# Patient Record
Sex: Female | Born: 1990 | Hispanic: Yes | Smoking: Never smoker
Health system: Southern US, Community
[De-identification: ages and names within clinical notes are randomized; demographics above are authoritative.]

## PROBLEM LIST (undated history)

## (undated) DIAGNOSIS — D649 Anemia, unspecified: Secondary | ICD-10-CM

## (undated) DIAGNOSIS — N83209 Unspecified ovarian cyst, unspecified side: Secondary | ICD-10-CM

## (undated) HISTORY — DX: Anemia, unspecified: D64.9

## (undated) HISTORY — DX: Unspecified ovarian cyst, unspecified side: N83.209

---
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
## 2012-01-12 HISTORY — PX: CHOLECYSTECTOMY: SHX55

## 2012-05-21 ENCOUNTER — Emergency Department: Payer: Self-pay | Admitting: Internal Medicine

## 2012-08-02 ENCOUNTER — Emergency Department: Payer: Self-pay | Admitting: Emergency Medicine

## 2012-08-02 LAB — CBC
HGB: 11.5 g/dL — ABNORMAL LOW (ref 12.0–16.0)
MCH: 24.5 pg — ABNORMAL LOW (ref 26.0–34.0)
MCHC: 34.2 g/dL (ref 32.0–36.0)
MCV: 72 fL — ABNORMAL LOW (ref 80–100)
Platelet: 385 10*3/uL (ref 150–440)
RDW: 15.4 % — ABNORMAL HIGH (ref 11.5–14.5)

## 2012-08-02 LAB — URINALYSIS, COMPLETE
Bacteria: NONE SEEN
Ketone: NEGATIVE
Nitrite: NEGATIVE
Ph: 6 (ref 4.5–8.0)
RBC,UR: 21 /HPF (ref 0–5)
Specific Gravity: 1.025 (ref 1.003–1.030)
Squamous Epithelial: 3

## 2012-08-02 LAB — COMPREHENSIVE METABOLIC PANEL
Albumin: 3.7 g/dL — ABNORMAL LOW (ref 3.8–5.6)
Anion Gap: 7 (ref 7–16)
BUN: 11 mg/dL (ref 7–18)
Bilirubin,Total: 0.2 mg/dL (ref 0.2–1.0)
Calcium, Total: 8.4 mg/dL — ABNORMAL LOW (ref 9.0–10.7)
Co2: 26 mmol/L (ref 21–32)
EGFR (African American): >60
EGFR (Non-African Amer.): >60
Glucose: 119 mg/dL — ABNORMAL HIGH (ref 65–99)
Osmolality: 280 (ref 275–301)
Potassium: 4 mmol/L (ref 3.5–5.1)

## 2012-08-02 LAB — WET PREP, GENITAL

## 2012-08-03 LAB — GC/CHLAMYDIA PROBE AMP

## 2013-01-19 LAB — URINALYSIS, COMPLETE
Bacteria: NONE SEEN
Blood: NEGATIVE
Ketone: NEGATIVE
Leukocyte Esterase: NEGATIVE
Nitrite: NEGATIVE
RBC,UR: <1 /HPF (ref 0–5)
Specific Gravity: 1.009 (ref 1.003–1.030)
Squamous Epithelial: 5

## 2013-01-19 LAB — COMPREHENSIVE METABOLIC PANEL
Albumin: 3.6 g/dL (ref 3.4–5.0)
Alkaline Phosphatase: 79 U/L
Anion Gap: 5 — ABNORMAL LOW (ref 7–16)
BUN: 7 mg/dL (ref 7–18)
Bilirubin,Total: 0.2 mg/dL (ref 0.2–1.0)
Calcium, Total: 8.8 mg/dL (ref 8.5–10.1)
Chloride: 106 mmol/L (ref 98–107)
Co2: 25 mmol/L (ref 21–32)
Creatinine: 0.61 mg/dL (ref 0.60–1.30)
EGFR (African American): >60
EGFR (Non-African Amer.): >60
Glucose: 122 mg/dL — ABNORMAL HIGH (ref 65–99)
Osmolality: 271 (ref 275–301)
Potassium: 3.8 mmol/L (ref 3.5–5.1)
Sodium: 136 mmol/L (ref 136–145)
Total Protein: 7.7 g/dL (ref 6.4–8.2)

## 2013-01-19 LAB — CBC
HCT: 32.1 % — ABNORMAL LOW (ref 35.0–47.0)
HGB: 10.2 g/dL — ABNORMAL LOW (ref 12.0–16.0)
MCHC: 31.6 g/dL — ABNORMAL LOW (ref 32.0–36.0)
Platelet: 457 10*3/uL — ABNORMAL HIGH (ref 150–440)
RDW: 19.5 % — ABNORMAL HIGH (ref 11.5–14.5)
WBC: 11 10*3/uL (ref 3.6–11.0)

## 2013-01-20 ENCOUNTER — Observation Stay: Payer: Self-pay | Admitting: Surgery

## 2013-01-20 LAB — GC/CHLAMYDIA PROBE AMP

## 2013-01-20 LAB — WET PREP, GENITAL

## 2013-01-20 LAB — AMYLASE: Amylase: 77 U/L (ref 25–115)

## 2013-01-21 LAB — COMPREHENSIVE METABOLIC PANEL
Albumin: 2.9 g/dL — ABNORMAL LOW (ref 3.4–5.0)
Alkaline Phosphatase: 74 U/L
Bilirubin,Total: 0.3 mg/dL (ref 0.2–1.0)
Calcium, Total: 8.5 mg/dL (ref 8.5–10.1)
Co2: 25 mmol/L (ref 21–32)
Creatinine: 0.47 mg/dL — ABNORMAL LOW (ref 0.60–1.30)
EGFR (African American): >60
EGFR (Non-African Amer.): >60
Osmolality: 271 (ref 275–301)
SGPT (ALT): 86 U/L — ABNORMAL HIGH (ref 12–78)
Sodium: 137 mmol/L (ref 136–145)

## 2013-01-21 LAB — CBC WITH DIFFERENTIAL/PLATELET
Basophil #: 0 10*3/uL (ref 0.0–0.1)
Basophil %: 0.1 %
Eosinophil #: 0.1 10*3/uL (ref 0.0–0.7)
HGB: 8.9 g/dL — ABNORMAL LOW (ref 12.0–16.0)
Lymphocyte #: 2.5 10*3/uL (ref 1.0–3.6)
MCHC: 31.5 g/dL — ABNORMAL LOW (ref 32.0–36.0)
MCV: 64 fL — ABNORMAL LOW (ref 80–100)
Neutrophil #: 2.9 10*3/uL (ref 1.4–6.5)
Neutrophil %: 48 %
Platelet: 346 10*3/uL (ref 150–440)
WBC: 6.1 10*3/uL (ref 3.6–11.0)

## 2013-01-21 LAB — AMYLASE: Amylase: 57 U/L (ref 25–115)

## 2013-05-29 ENCOUNTER — Emergency Department: Payer: Self-pay | Admitting: Emergency Medicine

## 2013-05-29 LAB — CBC
HCT: 36 % (ref 35.0–47.0)
HGB: 11.4 g/dL — ABNORMAL LOW (ref 12.0–16.0)
MCH: 23.3 pg — AB (ref 26.0–34.0)
MCHC: 31.8 g/dL — ABNORMAL LOW (ref 32.0–36.0)
MCV: 73 fL — AB (ref 80–100)
Platelet: 300 10*3/uL (ref 150–440)
RBC: 4.91 10*6/uL (ref 3.80–5.20)
RDW: 17.3 % — AB (ref 11.5–14.5)
WBC: 9.2 10*3/uL (ref 3.6–11.0)

## 2013-05-29 LAB — URINALYSIS, COMPLETE
BILIRUBIN, UR: NEGATIVE
BLOOD: NEGATIVE
Bacteria: NONE SEEN
Glucose,UR: NEGATIVE mg/dL (ref 0–75)
KETONE: NEGATIVE
Leukocyte Esterase: NEGATIVE
NITRITE: NEGATIVE
PROTEIN: NEGATIVE
Ph: 5 (ref 4.5–8.0)
RBC,UR: 1 /HPF (ref 0–5)
Specific Gravity: 1.03 (ref 1.003–1.030)
Squamous Epithelial: 10
WBC UR: 2 /HPF (ref 0–5)

## 2013-05-29 LAB — COMPREHENSIVE METABOLIC PANEL
ALT: 39 U/L (ref 12–78)
Albumin: 3.6 g/dL (ref 3.4–5.0)
Alkaline Phosphatase: 69 U/L
Anion Gap: 5 — ABNORMAL LOW (ref 7–16)
BILIRUBIN TOTAL: 0.2 mg/dL (ref 0.2–1.0)
BUN: 9 mg/dL (ref 7–18)
CHLORIDE: 106 mmol/L (ref 98–107)
Calcium, Total: 8.5 mg/dL (ref 8.5–10.1)
Co2: 27 mmol/L (ref 21–32)
Creatinine: 0.6 mg/dL (ref 0.60–1.30)
EGFR (Non-African Amer.): >60
Glucose: 109 mg/dL — ABNORMAL HIGH (ref 65–99)
OSMOLALITY: 275 (ref 275–301)
Potassium: 3.6 mmol/L (ref 3.5–5.1)
SGOT(AST): 24 U/L (ref 15–37)
Sodium: 138 mmol/L (ref 136–145)
TOTAL PROTEIN: 7.3 g/dL (ref 6.4–8.2)

## 2013-05-29 LAB — LIPASE, BLOOD: Lipase: 175 U/L (ref 73–393)

## 2013-05-29 LAB — PREGNANCY, URINE: Pregnancy Test, Urine: NEGATIVE m[IU]/mL

## 2013-09-10 ENCOUNTER — Ambulatory Visit: Payer: Self-pay | Admitting: Unknown Physician Specialty

## 2013-09-10 ENCOUNTER — Ambulatory Visit: Payer: Self-pay | Admitting: Gastroenterology

## 2013-09-13 LAB — PATHOLOGY REPORT

## 2013-09-15 LAB — CLOSTRIDIUM DIFFICILE(ARMC)

## 2013-09-18 LAB — STOOL CULTURE

## 2013-09-28 LAB — HM PAP SMEAR: HM PAP: NEGATIVE

## 2013-11-16 ENCOUNTER — Emergency Department: Payer: Self-pay | Admitting: Emergency Medicine

## 2013-11-16 LAB — CBC WITH DIFFERENTIAL/PLATELET
BASOS ABS: 0 10*3/uL (ref 0.0–0.1)
Basophil %: 0.2 %
EOS ABS: 0.2 10*3/uL (ref 0.0–0.7)
EOS PCT: 1.4 %
HCT: 39.8 % (ref 35.0–47.0)
HGB: 13.1 g/dL (ref 12.0–16.0)
LYMPHS PCT: 16.2 %
Lymphocyte #: 1.8 10*3/uL (ref 1.0–3.6)
MCH: 26 pg (ref 26.0–34.0)
MCHC: 32.8 g/dL (ref 32.0–36.0)
MCV: 79 fL — ABNORMAL LOW (ref 80–100)
MONO ABS: 0.6 x10 3/mm (ref 0.2–0.9)
MONOS PCT: 5.1 %
NEUTROS ABS: 8.4 10*3/uL — AB (ref 1.4–6.5)
Neutrophil %: 77.1 %
Platelet: 309 10*3/uL (ref 150–440)
RBC: 5.04 10*6/uL (ref 3.80–5.20)
RDW: 15 % — ABNORMAL HIGH (ref 11.5–14.5)
WBC: 10.9 10*3/uL (ref 3.6–11.0)

## 2013-11-16 LAB — URINALYSIS, COMPLETE
Bacteria: NONE SEEN
Bilirubin,UR: NEGATIVE
Glucose,UR: NEGATIVE mg/dL (ref 0–75)
Ketone: NEGATIVE
Leukocyte Esterase: NEGATIVE
Nitrite: NEGATIVE
PH: 5 (ref 4.5–8.0)
Protein: NEGATIVE
RBC,UR: 3 /HPF (ref 0–5)
SPECIFIC GRAVITY: 1.026 (ref 1.003–1.030)
Squamous Epithelial: 2

## 2013-11-16 LAB — COMPREHENSIVE METABOLIC PANEL
ALBUMIN: 3.5 g/dL (ref 3.4–5.0)
ALT: 26 U/L
Alkaline Phosphatase: 60 U/L
Anion Gap: 6 — ABNORMAL LOW (ref 7–16)
BUN: 6 mg/dL — AB (ref 7–18)
Bilirubin,Total: 0.4 mg/dL (ref 0.2–1.0)
CALCIUM: 8.4 mg/dL — AB (ref 8.5–10.1)
Chloride: 106 mmol/L (ref 98–107)
Co2: 26 mmol/L (ref 21–32)
Creatinine: 0.69 mg/dL (ref 0.60–1.30)
EGFR (African American): >60
EGFR (Non-African Amer.): >60
Glucose: 95 mg/dL (ref 65–99)
Osmolality: 273 (ref 275–301)
POTASSIUM: 3.6 mmol/L (ref 3.5–5.1)
SGOT(AST): 18 U/L (ref 15–37)
Sodium: 138 mmol/L (ref 136–145)
Total Protein: 7.7 g/dL (ref 6.4–8.2)

## 2013-11-16 LAB — LIPASE, BLOOD: Lipase: 142 U/L (ref 73–393)

## 2014-05-04 ENCOUNTER — Emergency Department: Payer: Self-pay | Admitting: Emergency Medicine

## 2014-05-04 LAB — CBC WITH DIFFERENTIAL/PLATELET
BASOS PCT: 0.1 %
Basophil #: 0 10*3/uL (ref 0.0–0.1)
EOS PCT: 0 %
Eosinophil #: 0 10*3/uL (ref 0.0–0.7)
HCT: 36.7 % (ref 35.0–47.0)
HGB: 11.7 g/dL — AB (ref 12.0–16.0)
Lymphocyte #: 1.7 10*3/uL (ref 1.0–3.6)
Lymphocyte %: 11.6 %
MCH: 24.7 pg — ABNORMAL LOW (ref 26.0–34.0)
MCHC: 31.9 g/dL — ABNORMAL LOW (ref 32.0–36.0)
MCV: 77 fL — AB (ref 80–100)
Monocyte #: 1 x10 3/mm — ABNORMAL HIGH (ref 0.2–0.9)
Monocyte %: 7.1 %
Neutrophil #: 11.7 10*3/uL — ABNORMAL HIGH (ref 1.4–6.5)
Neutrophil %: 81.2 %
PLATELETS: 310 10*3/uL (ref 150–440)
RBC: 4.74 10*6/uL (ref 3.80–5.20)
RDW: 14.8 % — ABNORMAL HIGH (ref 11.5–14.5)
WBC: 14.4 10*3/uL — AB (ref 3.6–11.0)

## 2014-05-04 LAB — URINALYSIS, COMPLETE
Bacteria: NONE SEEN
Bilirubin,UR: NEGATIVE
Glucose,UR: NEGATIVE mg/dL (ref 0–75)
KETONE: NEGATIVE
LEUKOCYTE ESTERASE: NEGATIVE
NITRITE: NEGATIVE
PH: 7 (ref 4.5–8.0)
Protein: NEGATIVE
Specific Gravity: 1.004 (ref 1.003–1.030)
Squamous Epithelial: 1

## 2014-05-06 LAB — URINE CULTURE

## 2014-05-09 LAB — CULTURE, BLOOD (SINGLE)
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
## 2014-09-12 LAB — COMPREHENSIVE METABOLIC PANEL
ALBUMIN: 4 g/dL (ref 3.4–5.0)
ALK PHOS: 66 U/L (ref 46–116)
Anion Gap: 8 (ref 7–16)
BUN: 7 mg/dL (ref 7–18)
Bilirubin,Total: 0.7 mg/dL (ref 0.2–1.0)
CALCIUM: 8.5 mg/dL (ref 8.5–10.1)
CO2: 25 mmol/L (ref 21–32)
Chloride: 103 mmol/L (ref 98–107)
Creatinine: 0.54 mg/dL — ABNORMAL LOW (ref 0.60–1.30)
EGFR (African American): >60
GLUCOSE: 95 mg/dL (ref 65–99)
Potassium: 3.6 mmol/L (ref 3.5–5.1)
SGOT(AST): 16 U/L (ref 15–37)
SGPT (ALT): 21 U/L (ref 14–63)
Sodium: 136 mmol/L (ref 136–145)
Total Protein: 7.4 g/dL (ref 6.4–8.2)

## 2016-03-30 ENCOUNTER — Emergency Department: Payer: Self-pay

## 2016-03-30 ENCOUNTER — Emergency Department
Admission: EM | Admit: 2016-03-30 | Discharge: 2016-03-30 | Disposition: A | Discharge status: Home / Self Care | Payer: Self-pay | Attending: Emergency Medicine | Admitting: Emergency Medicine

## 2016-03-30 ENCOUNTER — Encounter: Payer: Self-pay | Admitting: Emergency Medicine

## 2016-03-30 DIAGNOSIS — R112 Nausea with vomiting, unspecified: Secondary | ICD-10-CM | POA: Insufficient documentation

## 2016-03-30 DIAGNOSIS — B9789 Other viral agents as the cause of diseases classified elsewhere: Secondary | ICD-10-CM

## 2016-03-30 DIAGNOSIS — J069 Acute upper respiratory infection, unspecified: Secondary | ICD-10-CM | POA: Insufficient documentation

## 2016-03-30 DIAGNOSIS — B349 Viral infection, unspecified: Secondary | ICD-10-CM | POA: Insufficient documentation

## 2016-03-30 LAB — CBC WITH DIFFERENTIAL/PLATELET
Basophils Absolute: 0 10*3/uL (ref 0–0.1)
Basophils Relative: 0 %
Eosinophils Absolute: 0.3 10*3/uL (ref 0–0.7)
Eosinophils Relative: 6 %
HCT: 37.7 % (ref 35.0–47.0)
HEMOGLOBIN: 12.9 g/dL (ref 12.0–16.0)
LYMPHS ABS: 2.5 10*3/uL (ref 1.0–3.6)
LYMPHS PCT: 47 %
MCH: 26.1 pg (ref 26.0–34.0)
MCHC: 34.3 g/dL (ref 32.0–36.0)
MCV: 76.1 fL — AB (ref 80.0–100.0)
MONOS PCT: 9 %
Monocytes Absolute: 0.4 10*3/uL (ref 0.2–0.9)
NEUTROS ABS: 2 10*3/uL (ref 1.4–6.5)
NEUTROS PCT: 38 %
Platelets: 281 10*3/uL (ref 150–440)
RBC: 4.95 MIL/uL (ref 3.80–5.20)
RDW: 14.8 % — ABNORMAL HIGH (ref 11.5–14.5)
WBC: 5.2 10*3/uL (ref 3.6–11.0)

## 2016-03-30 LAB — COMPREHENSIVE METABOLIC PANEL
ALK PHOS: 57 U/L (ref 38–126)
ALT: 76 U/L — AB (ref 14–54)
AST: 48 U/L — ABNORMAL HIGH (ref 15–41)
Albumin: 4.2 g/dL (ref 3.5–5.0)
Anion gap: 9 (ref 5–15)
BILIRUBIN TOTAL: 0.5 mg/dL (ref 0.3–1.2)
BUN: 7 mg/dL (ref 6–20)
CALCIUM: 8.7 mg/dL — AB (ref 8.9–10.3)
CO2: 25 mmol/L (ref 22–32)
CREATININE: 0.61 mg/dL (ref 0.44–1.00)
Chloride: 103 mmol/L (ref 101–111)
Glucose, Bld: 110 mg/dL — ABNORMAL HIGH (ref 65–99)
Potassium: 3.2 mmol/L — ABNORMAL LOW (ref 3.5–5.1)
SODIUM: 137 mmol/L (ref 135–145)
Total Protein: 8 g/dL (ref 6.5–8.1)

## 2016-03-30 LAB — LIPASE, BLOOD: Lipase: 34 U/L (ref 11–51)

## 2016-03-30 MED ORDER — ONDANSETRON HCL 4 MG/2ML IJ SOLN
4.0000 mg | INTRAMUSCULAR | Status: AC
  Administered 2016-03-30: 4 mg via INTRAVENOUS
  Filled 2016-03-30: qty 2

## 2016-03-30 MED ORDER — HYDROCODONE-HOMATROPINE 5-1.5 MG/5ML PO SYRP
5.0000 mL | ORAL_SOLUTION | ORAL | Status: AC
  Administered 2016-03-30: 5 mL via ORAL
  Filled 2016-03-30: qty 5

## 2016-03-30 MED ORDER — ONDANSETRON 4 MG PO TBDP: ORAL_TABLET | ORAL | 0 refills | Status: DC

## 2016-03-30 MED ORDER — SODIUM CHLORIDE 0.9 % IV BOLUS (SEPSIS)
1000.0000 mL | INTRAVENOUS | Status: AC
  Administered 2016-03-30: 1000 mL via INTRAVENOUS

## 2016-03-30 MED ORDER — HYDROCODONE-HOMATROPINE 5-1.5 MG/5ML PO SYRP: 5.0000 mL | ORAL_SOLUTION | Freq: Four times a day (QID) | ORAL | 0 refills | Status: DC | PRN

## 2016-03-30 NOTE — ED Notes (Signed)
Pt states flu like symptoms for one week with accompanied nausea, vomiting and diarrhea. Pt states last diarrhea was 03/29/2016 and vomiting continues intermittently. Pt denies fever, chills. Pt with dry cough during iv initation noted.

## 2016-03-30 NOTE — ED Triage Notes (Signed)
Pt ambulatory to triage in NAD, report cold/flu sx x 1 week, reports cough, productive, tonight started coughing up blood.  Pt reports pain in throat and epigastric area.  Pt reports ongoing vomiting.  Pt NAD at this time.

## 2016-03-30 NOTE — Discharge Instructions (Signed)

## 2016-03-30 NOTE — ED Provider Notes (Signed)
[facility] Emergency Department Provider Note  ____________________________________________   First MD Initiated Contact with Patient 03/30/16 [phone]     (approximate)  I have reviewed the triage vital signs and the nursing notes.   HISTORY  Chief Complaint Hemoptysis and Emesis    HPI Tina Gutierrez is a 26 y.o. female with no chronic medical history who presents for evaluation of hemoptysis.  She states that about a week ago she developed URI symptoms including body aches, fever, cough, congestion.  All of her symptoms have improved but she states the cough has persisted and is productive.  Tonight she noticed that there was some blood mixed in with the sputum so she came in to be evaluated.  She states that in general she feels better than she did before except that she has also been vomiting over the last several days and now her throat feels raw as well.  She denies any current fever/chills, chest pain, shortness of breath, abdominal pain other than the burning sensation in her throat.  She states that the onset of the hemoptysis was acute with rest symptoms have been gradually improving over the last week.  Nothing in particular makes symptoms better nor worse.  She has not been taking any medication.   History reviewed. No pertinent past medical history.  There are no active problems to display for this patient.   History reviewed. No pertinent surgical history.  Prior to Admission medications   Medication Sig Start Date End Date Taking? Authorizing Provider  HYDROcodone-homatropine (HYCODAN) 5-1.5 MG/5ML syrup Take 5 mLs by mouth every 6 (six) hours as needed for cough. 03/30/16   Loleta Rose, MD  ondansetron (ZOFRAN ODT) 4 MG disintegrating tablet Allow 1-2 tablets to dissolve in your mouth every 8 hours as needed for nausea/vomiting 03/30/16   Loleta Rose, MD    Allergies Patient has no known allergies.  History reviewed. No pertinent family  history.  Social History Social History  Substance Use Topics  . Smoking status: Never Smoker  . Smokeless tobacco: Never Used  . Alcohol use No    Review of Systems Constitutional: Recent fever/chills, but improved over several days Eyes: No visual changes. ENT: No sore throat. Cardiovascular: Denies chest pain. Respiratory: Denies shortness of breath.  Productive cough with blood-tinged sputum. Gastrointestinal: Burning sensation in throat/esophagus from recent vomiting.  Persistent N/V for several days.  No diarrhea.  No constipation. Genitourinary: Negative for dysuria. Musculoskeletal: Negative for back pain. Skin: Negative for rash. Neurological: Negative for headaches, focal weakness or numbness.  10-point ROS otherwise negative.  ____________________________________________   PHYSICAL EXAM:  VITAL SIGNS: ED Triage Vitals  Enc Vitals Group     BP 03/30/16 0409 127/76     Pulse Rate 03/30/16 0409 (!) 101     Resp 03/30/16 0409 20     Temp 03/30/16 0409 98.9 F (37.2 C)     Temp Source 03/30/16 0409 Oral     SpO2 03/30/16 0409 96 %     Weight 03/30/16 0410 145 lb (65.8 kg)     Height 03/30/16 0410 5\' 2"  (1.575 m)     Head Circumference --      Peak Flow --      Pain Score 03/30/16 0410 0     Pain Loc --      Pain Edu? --      Excl. in GC? --     Constitutional: Alert and oriented. Well appearing and in no acute distress.  Eyes: Conjunctivae are normal. PERRL. EOMI. Head: Atraumatic. Nose: No congestion/rhinnorhea. Mouth/Throat: Mucous membranes are moist. Neck: No stridor.  No meningeal signs.   Cardiovascular: Normal rate, regular rhythm. Good peripheral circulation. Grossly normal heart sounds. Respiratory: Normal respiratory effort.  No retractions. Lungs CTAB.  Frequent cough, no hemoptysis witnessed Gastrointestinal: Soft and nontender. No distention.  Musculoskeletal: No lower extremity tenderness nor edema. No gross deformities of  extremities. Neurologic:  Normal speech and language. No gross focal neurologic deficits are appreciated.  Skin:  Skin is warm, dry and intact. No rash noted. Psychiatric: Mood and affect are normal. Speech and behavior are normal.  ____________________________________________   LABS (all labs ordered are listed, but only abnormal results are displayed)  Labs Reviewed  CBC WITH DIFFERENTIAL/PLATELET - Abnormal; Notable for the following:       Result Value   MCV 76.1 (*)    RDW 14.8 (*)    All other components within normal limits  COMPREHENSIVE METABOLIC PANEL - Abnormal; Notable for the following:    Potassium 3.2 (*)    Glucose, Bld 110 (*)    Calcium 8.7 (*)    AST 48 (*)    ALT 76 (*)    All other components within normal limits  LIPASE, BLOOD   ____________________________________________  EKG  None - EKG not ordered by ED physician ____________________________________________  RADIOLOGY   Dg Chest 2 View  Result Date: 03/30/2016 CLINICAL DATA:  Productive cough for 1 week. Hemoptysis for 3 hours. Nonsmoker. EXAM: CHEST  2 VIEW COMPARISON:  05/04/2014 FINDINGS: The heart size and mediastinal contours are within normal limits. Both lungs are clear. The visualized skeletal structures are unremarkable. IMPRESSION: No active cardiopulmonary disease. Electronically Signed   By: Burman Nieves M.D.   On: 03/30/2016 04:52    ____________________________________________   PROCEDURES  Procedure(s) performed:   Procedures   Critical Care performed: No ____________________________________________   INITIAL IMPRESSION / ASSESSMENT AND PLAN / ED COURSE  Pertinent labs & imaging results that were available during my care of the patient were reviewed by me and considered in my medical decision making (see chart for details).  The patient is well-appearing and in no acute distress.  Given that she has been coughing for about a week or suspect this is  superficial/minor hemoptysis due to the irritation in her airways.  I will evaluate with a two-view chest x-ray and basic labs but her vital signs are reassuring.  I am giving a liter of fluids given the report of persistent vomiting and the very mild tachycardia.  I will reassess after the workup is complete and after she has gotten fluids, Zofran, and I will likely also give a GI cocktail given that she has an irritated esophagus from the vomiting (as part of a PO challenge after the Zofran).   Clinical Course as of Mar 30 640  Sat Mar 30, 2016  1610 Patient feels better, tolerating [facility].    [CF]    Clinical Course User Index [CF] Loleta Rose, MD    ____________________________________________  FINAL CLINICAL IMPRESSION(S) / ED DIAGNOSES  Final diagnoses:  Viral URI with cough  Nausea and vomiting, intractability of vomiting not specified, unspecified vomiting type  Acute viral syndrome     MEDICATIONS GIVEN DURING THIS VISIT:  Medications  ondansetron (ZOFRAN) injection 4 mg (4 mg Intravenous Given 03/30/16 0458)  sodium chloride 0.9 % bolus 1,000 mL (0 mLs Intravenous Stopped 03/30/16 0632)  HYDROcodone-homatropine (HYCODAN) 5-1.5 MG/5ML syrup 5 mL (  5 mLs Oral Given 03/30/16 0534)     NEW OUTPATIENT MEDICATIONS STARTED DURING THIS VISIT:  New Prescriptions   HYDROCODONE-HOMATROPINE (HYCODAN) 5-1.5 MG/5ML SYRUP    Take 5 mLs by mouth every 6 (six) hours as needed for cough.   ONDANSETRON (ZOFRAN ODT) 4 MG DISINTEGRATING TABLET    Allow 1-2 tablets to dissolve in your mouth every 8 hours as needed for nausea/vomiting    Modified Medications   No medications on file    Discontinued Medications   No medications on file     Note:  This document was prepared using Dragon voice recognition software and may include unintentional dictation errors.    Loleta Roseory Crystelle Ferrufino, MD 03/30/16 [phone]

## 2017-05-07 ENCOUNTER — Ambulatory Visit: Payer: Self-pay | Admitting: Maternal Newborn

## 2017-06-27 ENCOUNTER — Inpatient Hospital Stay
Admission: EM | Admit: 2017-06-27 | Discharge: 2017-06-28 | DRG: 761 | Disposition: A | Discharge status: Home / Self Care | Payer: Self-pay | Attending: Obstetrics and Gynecology | Admitting: Obstetrics and Gynecology

## 2017-06-27 ENCOUNTER — Inpatient Hospital Stay: Payer: Self-pay

## 2017-06-27 ENCOUNTER — Other Ambulatory Visit: Payer: Self-pay

## 2017-06-27 DIAGNOSIS — N939 Abnormal uterine and vaginal bleeding, unspecified: Secondary | ICD-10-CM

## 2017-06-27 DIAGNOSIS — N920 Excessive and frequent menstruation with regular cycle: Principal | ICD-10-CM | POA: Diagnosis present

## 2017-06-27 DIAGNOSIS — D649 Anemia, unspecified: Secondary | ICD-10-CM | POA: Diagnosis present

## 2017-06-27 HISTORY — DX: Abnormal uterine and vaginal bleeding, unspecified: N93.9

## 2017-06-27 LAB — BASIC METABOLIC PANEL
ANION GAP: 6 (ref 5–15)
BUN: 8 mg/dL (ref 6–20)
CALCIUM: 8.9 mg/dL (ref 8.9–10.3)
CO2: 23 mmol/L (ref 22–32)
Chloride: 107 mmol/L (ref 101–111)
Creatinine, Ser: 0.45 mg/dL (ref 0.44–1.00)
GFR calc Af Amer: >60 mL/min (ref >60)
GLUCOSE: 121 mg/dL — AB (ref 65–99)
Potassium: 4.1 mmol/L (ref 3.5–5.1)
Sodium: 136 mmol/L (ref 135–145)

## 2017-06-27 LAB — URINALYSIS, COMPLETE (UACMP) WITH MICROSCOPIC
BILIRUBIN URINE: NEGATIVE
Bacteria, UA: NONE SEEN
Glucose, UA: NEGATIVE mg/dL
KETONES UR: NEGATIVE mg/dL
LEUKOCYTES UA: NEGATIVE
Nitrite: NEGATIVE
Protein, ur: 30 mg/dL — AB
SPECIFIC GRAVITY, URINE: 1.014 (ref 1.005–1.030)
pH: 6 (ref 5.0–8.0)

## 2017-06-27 LAB — CBC
HCT: 25.1 % — ABNORMAL LOW (ref 35.0–47.0)
Hemoglobin: 7.7 g/dL — ABNORMAL LOW (ref 12.0–16.0)
MCH: 17.4 pg — AB (ref 26.0–34.0)
MCHC: 30.8 g/dL — AB (ref 32.0–36.0)
MCV: 56.7 fL — AB (ref 80.0–100.0)
PLATELETS: 464 10*3/uL — AB (ref 150–440)
RBC: 4.43 MIL/uL (ref 3.80–5.20)
RDW: 20.3 % — AB (ref 11.5–14.5)
WBC: 8.3 10*3/uL (ref 3.6–11.0)

## 2017-06-27 LAB — PREPARE RBC (CROSSMATCH)

## 2017-06-27 LAB — PROTIME-INR
INR: 1
PROTHROMBIN TIME: 13.1 s (ref 11.4–15.2)

## 2017-06-27 LAB — ABO/RH: ABO/RH(D): A POS

## 2017-06-27 LAB — HCG, QUANTITATIVE, PREGNANCY: hCG, Beta Chain, Quant, S: <1 m[IU]/mL (ref <5)

## 2017-06-27 LAB — APTT: aPTT: 27 seconds (ref 24–36)

## 2017-06-27 MED ORDER — SODIUM CHLORIDE 0.9 % IV SOLN: 10.0000 mL/h | Freq: Once | INTRAVENOUS | Status: DC

## 2017-06-27 MED ORDER — SODIUM CHLORIDE 0.9 % IV SOLN
INTRAVENOUS | Status: DC
  Administered 2017-06-27 – 2017-06-28 (×3): via INTRAVENOUS

## 2017-06-27 MED ORDER — FAMOTIDINE 20 MG PO TABS
20.0000 mg | ORAL_TABLET | Freq: Two times a day (BID) | ORAL | Status: DC
  Administered 2017-06-27 – 2017-06-28 (×2): 20 mg via ORAL
  Filled 2017-06-27 (×2): qty 1

## 2017-06-27 MED ORDER — SODIUM CHLORIDE 0.9 % IV SOLN
Freq: Once | INTRAVENOUS | Status: AC
  Administered 2017-06-27: 13:00:00 via INTRAVENOUS

## 2017-06-27 MED ORDER — IBUPROFEN 600 MG PO TABS
600.0000 mg | ORAL_TABLET | Freq: Four times a day (QID) | ORAL | Status: DC | PRN
  Administered 2017-06-27 – 2017-06-28 (×4): 600 mg via ORAL
  Filled 2017-06-27 (×5): qty 1

## 2017-06-27 MED ORDER — PNEUMOCOCCAL VAC POLYVALENT 25 MCG/0.5ML IJ INJ
0.5000 mL | INJECTION | INTRAMUSCULAR | Status: DC
  Filled 2017-06-27 (×2): qty 0.5

## 2017-06-27 NOTE — ED Provider Notes (Signed)
[facility] Emergency Department Provider Note   ____________________________________________    I have reviewed the triage vital signs and the nursing notes.   HISTORY  Chief Complaint Vaginal Bleeding     HPI Tina Gutierrez is a 27 y.o. female who presents with complaints of heavy vaginal bleeding.  Patient reports she has always had irregular cycles and they have been heavy in the past, started Ortho [facility]ra on May 2, the next day developed cramping and vaginal bleeding that was particularly heavy.  This is continued over the last 2 weeks, the last 3 days she has had extraordinary heavy bleeding requiring wearing a pad and an adult diaper.  She has felt weak and mildly lightheaded.  Reports cramping and pain has mostly improved but for several days it was severe.  Past Medical History:  Diagnosis Date  . Anemia   . Ovarian cyst     There are no active problems to display for this patient.   Past Surgical History:  Procedure Laterality Date  . CHOLECYSTECTOMY  01/2012   [facility]    Prior to Admission medications   Medication Sig Start Date End Date Taking? Authorizing Provider  HYDROcodone-homatropine (HYCODAN) 5-1.5 MG/5ML syrup Take 5 mLs by mouth every 6 (six) hours as needed for cough. 03/30/16   Loleta Rose, MD  ondansetron (ZOFRAN ODT) 4 MG disintegrating tablet Allow 1-2 tablets to dissolve in your mouth every 8 hours as needed for nausea/vomiting 03/30/16   Loleta Rose, MD     Allergies Patient has no known allergies.  Family History  Problem Relation Age of Onset  . Diabetes Maternal Grandfather        TYPE 2    Social History Social History   Tobacco Use  . Smoking status: Never Smoker  . Smokeless tobacco: Never Used  Substance Use Topics  . Alcohol use: No  . Drug use: Never    Review of Systems  Constitutional: Mild weakness Eyes: No visual changes.  ENT: No sore throat. Cardiovascular: Denies chest  pain. Respiratory: Denies shortness of breath. Gastrointestinal: No vomiting Genitourinary: As above Musculoskeletal: Negative for back pain. Skin: Pallor Neurological: Negative for headaches or weakness   ____________________________________________   PHYSICAL EXAM:  VITAL SIGNS: ED Triage Vitals  Enc Vitals Group     BP 06/27/17 0830 (!) 144/70     Pulse Rate 06/27/17 0830 87     Resp 06/27/17 0830 18     Temp --      Temp Source 06/27/17 0830 Oral     SpO2 06/27/17 0830 99 %     Weight 06/27/17 0831 86.2 kg (190 lb)     Height 06/27/17 0831 1.575 m ( )     Head Circumference --      Peak Flow --      Pain Score 06/27/17 0830 5     Pain Loc --      Pain Edu? --      Excl. in GC? --     Constitutional: Alert and oriented. No acute distress. Pleasant and interactive Eyes: Conjunctivae are normal.   Nose: No congestion/rhinnorhea. Mouth/Throat: Mucous membranes are moist.    Cardiovascular: Normal rate, regular rhythm. Grossly normal heart sounds.  Good peripheral circulation. Respiratory: Normal respiratory effort.  No retractions. Gastrointestinal: Soft and nontender. No distention.   Genitourinary: deferred Musculoskeletal: No lower extremity tenderness nor edema.  Warm and well perfused Neurologic:  Normal speech and language. No gross focal neurologic deficits  are appreciated.  Skin:  Skin is warm, dry and intact. No rash noted. Psychiatric: Mood and affect are normal. Speech and behavior are normal.  ____________________________________________   LABS (all labs ordered are listed, but only abnormal results are displayed)  Labs Reviewed  CBC - Abnormal; Notable for the following components:      Result Value   Hemoglobin 7.7 (*)    HCT 25.1 (*)    MCV 56.7 (*)    MCH 17.4 (*)    MCHC 30.8 (*)    RDW 20.3 (*)    Platelets 464 (*)    All other components within normal limits  BASIC METABOLIC PANEL - Abnormal; Notable for the following components:    Glucose, Bld 121 (*)    All other components within normal limits  APTT  PROTIME-INR  URINALYSIS, COMPLETE (UACMP) WITH MICROSCOPIC  HCG, QUANTITATIVE, PREGNANCY  POC URINE PREG, ED  TYPE AND SCREEN  PREPARE RBC (CROSSMATCH)  ABO/RH   ____________________________________________  EKG  None ____________________________________________  RADIOLOGY  None ____________________________________________   PROCEDURES  Procedure(s) performed: No  Procedures   Critical Care performed: yes  CRITICAL CARE Performed by: Jene Every   Total critical care time: 30 minutes  Critical care time was exclusive of separately billable procedures and treating other patients.  Critical care was necessary to treat or prevent imminent or life-threatening deterioration.  Critical care was time spent personally by me on the following activities: development of treatment plan with patient and/or surrogate as well as nursing, discussions with consultants, evaluation of patient's response to treatment, examination of patient, obtaining history from patient or surrogate, ordering and performing treatments and interventions, ordering and review of laboratory studies, ordering and review of radiographic studies, pulse oximetry and re-evaluation of patient's condition.  ____________________________________________   INITIAL IMPRESSION / ASSESSMENT AND PLAN / ED COURSE  Pertinent labs & imaging results that were available during my care of the patient were reviewed by me and considered in my medical decision making (see chart for details).  Patient presents with complaints of heavy vaginal bleeding as well as some weakness.  Symptoms ongoing for nearly 2 weeks but worse the last 3 days.  She continues to have bleeding currently.  Hemoglobin is 7.7 down from what appears to be a baseline of 12.5.  Consult to Dr. Elesa Massed of gynecology.  Given active bleeding and significant drop in hemoglobin will  transfuse the patient packed red blood cells.  Patient has given consent  ----------------------------------------- 9:56 AM on 06/27/2017 -----------------------------------------  Nurse midwife here to admit the patient to the hospital, hCG is still pending    ____________________________________________   FINAL CLINICAL IMPRESSION(S) / ED DIAGNOSES  Final diagnoses:  Excessive vaginal bleeding  Symptomatic anemia        Note:  This document was prepared using Dragon voice recognition software and may include unintentional dictation errors.    Jene Every, MD 06/27/17 [phone]

## 2017-06-27 NOTE — H&P (Addendum)
Consult History and Physical   SERVICE: Gynecology [facility]  Patient Name: Tina Gutierrez Patient MRN:   [id number]  CC: vaginal bleeding   Chief Complaint Vaginal Bleeding  HPI Tina Gutierrez is a 27 y.o. female who presents with complaints of heavy vaginal bleeding.  Patient reports she has always had irregular cycles and they have been heavy in the past, started Ortho Evra on May 2, the next day developed cramping and vaginal bleeding that was particularly heavy.  This is continued over the last 2 weeks, the last 3 days she has had extraordinary heavy bleeding requiring wearing a pad and an adult diaper.  She has felt weak and mildly lightheaded.  Reports cramping and pain has mostly improved but for several days it was severe.      Past Medical History:  Diagnosis Date  . Anemia   . Ovarian cyst      Review of Systems: positives in bold GEN: No fevers, chills, weight changes, appetite changes, fatigue, night sweats HEENT: no  HA, vision changes, hearing loss, congestion, rhinorrhea, sinus pressure, dysphagia CV: no CP, palpitations PULM:  No SOB, cough GI: No abd pain, N/V/D/C GU: No dysuria, urgency, frequency MSK: No arthralgias, myalgias, back pain, swelling SKIN:  No rashes, color changes, pallor NEURO: No numbness, weakness, tingling, seizures, dizziness, tremors PSYCH: No depression, anxiety, behavioral problems, confusion  HEME/LYMPH: No easy bruising or bleeding ENDO: No heat/cold intolerance  Past Obstetrical History: OB History    Gravida  0   Para  0   Term  0   Preterm  0   AB  0   Living  0     SAB  0   TAB  0   Ectopic  0   Multiple  0   Live Births  0           Past Gynecologic History: Patient's last menstrual period was 06/16/2017. Menstrual frequency very irreg and she has bled for as long as a year. She cannot know when her period starts and then it may stop for 3 weeks and then another one starts.  At times, she has to  change a pad q1 h and also wear a Depends to prevent soiling her clothes.   Past Medical History: Past Medical History:  Diagnosis Date  . Anemia   . Ovarian cyst     Past Surgical History:   Past Surgical History:  Procedure Laterality Date  . CHOLECYSTECTOMY  01/2012   [facility]    Family History:  family history includes Diabetes in her maternal grandfather.  Social History:  Social History   Socioeconomic History  . Marital status: Single    Spouse name: Not on file  . Number of children: 0  . Years of education: 56  . Highest education level: Not on file  Occupational History  . Not on file  Social Needs  . Financial resource strain: Not on file  . Food insecurity:    Worry: Not on file    Inability: Not on file  . Transportation needs:    Medical: Not on file    Non-medical: Not on file  Tobacco Use  . Smoking status: Never Smoker  . Smokeless tobacco: Never Used  Substance and Sexual Activity  . Alcohol use: No  . Drug use: Never  . Sexual activity: Yes    Birth control/protection: Pill  Lifestyle  . Physical activity:    Days per week: Not on file  Minutes per session: Not on file  . Stress: Not on file  Relationships  . Social connections:    Talks on phone: Not on file    Gets together: Not on file    Attends religious service: Not on file    Active member of club or organization: Not on file    Attends meetings of clubs or organizations: Not on file    Relationship status: Not on file  . Intimate partner violence:    Fear of current or ex partner: Not on file    Emotionally abused: Not on file    Physically abused: Not on file    Forced sexual activity: Not on file  Other Topics Concern  . Not on file  Social History Narrative  . Not on file    Home Medications:  Medications reconciled in EPIC  No current facility-administered medications on file prior to encounter.    Current Outpatient Medications on File Prior to Encounter   Medication Sig Dispense Refill  . Acetaminophen-Caff-Pyrilamine (MIDOL MAX ST MENSTRUAL) 500-60-15 MG TABS Take 1 tablet by mouth every 6 (six) hours as needed (for pain).    Marland Kitchen ibuprofen (ADVIL,MOTRIN) 200 MG tablet Take 200 mg by mouth daily as needed for moderate pain.    . methocarbamol (ROBAXIN) 500 MG tablet Take 500 mg by mouth every 4 (four) hours as needed for muscle spasms.    . Norgestimate-Ethinyl Estradiol Triphasic (TRI-LO-MARZIA) 0.18/0.215/0.25 MG-25 MCG tab Take 1 tablet by mouth at bedtime.      Allergies:  No Known Allergies  Physical Exam:  Pulse Rate:  [80-87] 80 (05/17 0959) Resp:  [16-18] 16 (05/17 0959) BP: (115-144)/(70-74) 115/74 (05/17 0959) SpO2:  [97 %-100 %] 100 % (05/17 0959) Weight:  [190 lb (86.2 kg)] 190 lb (86.2 kg) (05/17 0831)   General Appearance:  Well developed, well nourished, no acute distress, alert and oriented x3 HEENT:  Normocephalic atraumatic, extraocular movements intact, moist mucous membranes Cardiovascular:  Normal S1/S2, regular rate and rhythm, no murmurs Pulmonary:  clear to auscultation, no wheezes, rales or rhonchi, symmetric air entry, good air exchange Abdomen:  Bowel sounds present, soft, nontender, nondistended, no abnormal masses, no epigastric pain Extremities:  Full range of motion, no pedal edema,  no tenderness Skin:  normal coloration and turgor, no rashes, no suspicious skin lesions noted  Neurologic:  A,A& O x 3, MAE well Psychiatric:  Normal mood and affect, appropriate Pelvic:  Peri pad in place with bright red blood mod amt. GYN US done:   REASON FOR EXAM:    menorrhagia  COMMENTS:   PROCEDURE:     [place]  - US PELVIS EXAM  - Jan 20 2009  2:49PM   RESULT:   The uterus demonstrates a homogeneous echotexture. The uterus measures  7.66  x 4.57 x 3.43 cm. Endometrial thickness is 3.3 mm. The right ovary  measures  2.92 x 2.24 x 2.0 cm and the left 2.88 x 2.77 x 2.31 cm. There is no evidence of pelvic masses,  free fluid or loculated fluid collections. The kidneys are grossly unremarkable. The urinary bladder is distended.   Areas of flow are identified within the right and left ovaries.   IMPRESSION:   1. Unremarkable pelvic ultrasound. Note; transabdominal imaging was performed.   Labs/Studies:   CBC and Coags:  Lab Results  Component Value Date   WBC 8.3 06/27/2017   NEUTOPHILPCT 38 03/30/2016   EOSPCT 6 03/30/2016   BASOPCT 0 03/30/2016  LYMPHOPCT 47 03/30/2016   HGB 7.7 (L) 06/27/2017   HCT 25.1 (L) 06/27/2017   MCV 56.7 (L) 06/27/2017   PLT 464 (H) 06/27/2017   INR 1.00 06/27/2017   CMP:  Lab Results  Component Value Date   NA 136 06/27/2017   K 4.1 06/27/2017   CL 107 06/27/2017   CO2 23 06/27/2017   BUN 8 06/27/2017   CREATININE 0.45 06/27/2017   CREATININE 0.61 03/30/2016   CREATININE 0.54 (L) 05/04/2014   PROT 8.0 03/30/2016   BILITOT 0.5 03/30/2016   ALT 76 (H) 03/30/2016   AST 48 (H) 03/30/2016   ALKPHOS 57 03/30/2016    Other Imaging: No results found.   Assessment / Plan:   KRISTOPHER DELK is a 27 y.o. G0P0000 who presents with heavy vaginal bleeding.   1. Menorrhagia leading to severe Anemia P: 1. Admit to Women's unit at [facility].      2. Disc case with Dr Elesa Massed and agrees with blood transfusions. 2 u pc ordered and IV fluids. Reg diet.      3. [place] does not indicate any need for D&C at this time. Myrtie Cruise, MSN, CNM, FNP Certified Nurse Midwife [facility]

## 2017-06-27 NOTE — ED Notes (Signed)
Pt in bathroom when first called for triage.

## 2017-06-27 NOTE — ED Triage Notes (Addendum)
Pt states MD started her on birth control and next day she started having vaginal bleeding. States is having to wear adult diapers d/t saturating through so often. Changed 4 times over night. States last night started with abd cramping. States bleeding began last Monday (10 days ago). States passing clots.   Took a muscle relaxer, midol, ibu this AM. States some relief.  Alert, oriented, ambulatory.

## 2017-06-28 LAB — TYPE AND SCREEN
ABO/RH(D): A POS
Antibody Screen: NEGATIVE
Unit division: 0
Unit division: 0

## 2017-06-28 LAB — BPAM RBC
BLOOD PRODUCT EXPIRATION DATE: 201905282359
BLOOD PRODUCT EXPIRATION DATE: 201905292359
ISSUE DATE / TIME: 201905171347
ISSUE DATE / TIME: 201905171745
Unit Type and Rh: 600
Unit Type and Rh: 6200

## 2017-06-28 LAB — TSH
TSH: 3.106 u[IU]/mL (ref 0.350–4.500)
TSH: 4.062 u[IU]/mL (ref 0.350–4.500)

## 2017-06-28 LAB — HEMOGLOBIN AND HEMATOCRIT, BLOOD
HEMATOCRIT: 31.4 % — AB (ref 35.0–47.0)
HEMOGLOBIN: 10 g/dL — AB (ref 12.0–16.0)

## 2017-06-28 MED ORDER — TRANEXAMIC ACID 650 MG PO TABS: 1300.0000 mg | ORAL_TABLET | Freq: Three times a day (TID) | ORAL | 2 refills | Status: DC

## 2017-06-28 NOTE — Progress Notes (Addendum)
Called from pharmacy in [facility] and [place] cannot be given with Angelene Giovanni due to risk for DVT. Will continue on the OrthoEvra.Pt wants a pregnancy ASAP. Myrtie Cruise, MSN, CNM, FNP Certified Nurse Midwife [facility]

## 2017-06-28 NOTE — Progress Notes (Signed)
Pt discharging to home. Provider rounding, orders placed. D/C instructions reviewed, pt to f/u in 1 week. Pt to eat lunch before leaving.

## 2017-06-28 NOTE — Plan of Care (Signed)
Patient's vital signs stable; 2 units PRBC's finished at 2045; Hgb 10.0 and Hct 31.4 (4 hours after blood completely infused); voiding; stooled; small amount vaginal bleeding (mensus); tolerating regular diet well; good po fluids; pain controlled with po motrin; patient's mom at bedside and attentive.

## 2017-06-28 NOTE — Discharge Instructions (Addendum)
Informacin sobre Geologist, engineering (Hysterectomy Information) La histerectoma es una ciruga que se realiza para extirpar el tero. Despus de la ciruga, no volver a IT consultant. Adems, no podr quedar embarazada. MOTIVOS PARA REALIZAR ESTA CIRUGA  Tiene un sangrado que no es normal y que se repite.  Tiene un dolor persistente (crnico) en la parte baja del vientre (plvico).  Tiene una infeccin persistente.  El revestimiento interno del tero crece fuera de este rgano.  El endometrio crece en el msculo del tero.  El tero cae hacia la vagina.  Tiene un bulto en el tero que le causa problemas.  Presenta clulas que podran convertirse en cncer (clulas precancerosas).  Tiene cncer de tero o de cuello. TIPOS Hay 3 tipos de histerectoma: Segn el tipo, en la ciruga:  Se extirpar slo la parte superior del tero.  Se extirpar el tero y el cuello del tero.  Se extirpar el tero, el cuello del tero y el tejido fibroso que sostiene el tero en la pelvis. FORMAS EN QUE SE PUEDE REALIZAR UNA HISTERECTOMA Hay 5 maneras en que se puede realizar [address] tipo de [place].  Se realiza un corte (incisin) en el vientre (abdomen). A travs del corte se extrae el tero.  Se hace una incisin en la vagina. A travs del corte se extrae el tero.  Se hacen tres o cuatro incisiones en el abdomen. A travs de uno de los cortes se coloca un dispositivo quirrgico con una cmara. El tero se corta en trozos pequeos. El tero se extrae a travs de unos cortes realizados en la vagina.  Se hacen tres o cuatro incisiones en el abdomen. A travs de uno de los cortes se coloca un dispositivo quirrgico con una cmara. El tero se extrae a travs de la vagina.  Se hacen tres o cuatro incisiones en el abdomen. Un dispositivo quirrgico controlado por una computadora toma una imagen visual. El dispositivo ayuda al cirujano a Chief Operating Officer el instrumental quirrgico. El tero se  corta en trozos pequeos. Las secciones se retiran a travs de los cortes o por la vagina. QU ESPERAR DESPUES DE LA CIRUGA  Le darn medicamentos para el dolor.  Necesitar ayuda con las tareas de la casa durante 3 a 5 das despus de la [place].  Tendr que ver al mdico despus de 2 a 4 semanas de la [place].  Probablemente tenga sofocos, sudoracin nocturna y dificultades para dormir.  Deber hacer exmenes de [place] en el futuro, si la causa de la ciruga fue un diagnstico de cncer. Hable con su mdico: Es conveniente que se haga controles regulares. Esta informacin no tiene Theme park manager el consejo del mdico. Asegrese de hacerle al mdico cualquier pregunta que tenga. Document Released: 07/30/2011 Document Revised: 11/18/2012 Document Reviewed: 10/05/2012 Elsevier Interactive Patient Education  2018 [employer].           Anemia Anemia La anemia es una afeccin en la cual no hay suficientes glbulos rojos o hemoglobina. La hemoglobina es la sustancia de los glbulos rojos que lleva el oxgeno. Cuando no hay suficientes glbulos rojos o hemoglobina (est anmico), su cuerpo no puede recibir el oxgeno suficiente, y es posible que sus rganos no funcionen correctamente. Como [place], es posible que se sienta muy cansado o sufra otros problemas. Cules son las causas? Las causas ms frecuentes de anemia son:  Tina Gutierrez. La anemia puede ser causada por un sangrado excesivo dentro o fuera del cuerpo, incluido el sangrado del intestino o del perodo  en las mujeres.  Dficit nutricional.  Enfermedad heptica, tiroidea o renal (crnicas).  Trastornos de la mdula sea.  Cncer y tratamientos para Management consultant.  VIH (virus de inmunodeficiencia [place]) y SIDA (sndrome de inmunodeficiencia adquirida).  Tratamientos para el VIH y [place].  Problemas en el bazo.  Enfermedades de [employer].  Infecciones, medicamentos y enfermedades autoinmunes que  [employer] glbulos rojos.  Cules son los signos o los sntomas? Los sntomas de esta afeccin incluyen los siguientes:  Debilidad leve.  Mareos.  Dolor de [place].  Sensacin de latidos cardacos irregulares o ms rpidos que lo normal (palpitaciones).  Falta de aire, especialmente con el ejercicio.  Palidez.  Sensibilidad al fro.  Dispepsia.  Nuseas.  Dificultad para dormir.  Dificultad para concentrarse.  Los sntomas pueden ocurrir repentinamente o Audiological scientist. Si la anemia es leve, es posible que no tenga sntomas. Cmo se diagnostica? Esta afeccin se diagnostica en funcin de lo siguiente:  Anlisis de sangre.  Sus antecedentes mdicos.  Un examen fsico.  Biopsia de mdula sea.  Adems, el mdico puede controlar si hay sangre en sus heces (materia fecal) y Education officer, environmental anlisis adicionales para Engineer, manufacturing la causa del sangrado. Tambin pueden hacerle otros estudios, por ejemplo:  Pruebas de diagnstico por imgenes, como una resonancia magntica (RM) o una exploracin por tomografa computarizada (TC).  Endoscopia.  Colonoscopia.  Cmo se trata? El tratamiento de esta afeccin depende de la causa. Si contina perdiendo [employer], es posible que necesite recibir tratamiento en un hospital. El tratamiento puede incluir lo siguiente:  Tomar suplementos de hierro, vitamina B12 o cido flico.  Tomar un medicamento para las hormonas (eritropoyetina) que puede ayudar a Radio producer de glbulos rojos.  Recibir una transfusin de [place]. Esta ser necesaria si pierde [employer].  Realizar cambios en la dieta.  Someterse a [place] para Public house manager.  Siga estas indicaciones en su casa:  Tome los medicamentos de venta libre y los recetados solamente como se lo haya indicado el mdico.  Tome los suplementos solamente como se lo haya indicado el mdico.  Siga las instrucciones de la dieta que le hayan  dado.  Concurra a todas las visitas de [address] se lo haya indicado el mdico. Esto es importante. Comunquese con un mdico si:  Tiene nuevos sangrados en cualquier parte del cuerpo. Solicite ayuda de inmediato si:  Se siente muy dbil.  Le falta el aire.  Siente dolor en la espalda, el abdomen o el pecho.  Se siente mareado o sufre un desmayo.  Tiene dificultad para concentrarse.  Las heces son alquitranadas, sanguinolentas o negras.  Vomita repetidamente o vomita sangre. Resumen  La anemia es una afeccin en la que no hay suficientes glbulos rojos o la cantidad suficiente de la sustancia de los glbulos rojos que transporta el oxgeno (hemoglobina).  Los sntomas pueden ocurrir repentinamente o Audiological scientist.  Si la anemia es leve, es posible que no tenga sntomas.  Esta afeccin se diagnostica mediante anlisis de sangre y un examen fsico, y en funcin de sus antecedentes mdicos. Pueden ser necesarios otros estudios.  El tratamiento de esta afeccin depende de la causa de la anemia. Esta informacin no tiene Theme park manager el consejo del mdico. Asegrese de hacerle al mdico cualquier pregunta que tenga. Document Released: 01/28/2005 Document Revised: 05/20/2016 Document Reviewed: 05/20/2016 Elsevier Interactive Patient Education  2018 [employer].   Pt needs to follow up at [facility] in 1 week with Dr Elesa Massed  or Dr Dalbert Garnet. Take the Lysteda 1300 mg 3 x a day for heavy bleeding. Continue the Ortho-Evra patch.

## 2017-06-28 NOTE — Discharge Summary (Addendum)
Discharge Summaries Cosign Needed  Date of Service: 06/22/2016 10:13 AM Sharee Pimple, CNM  Obstetrics/Gynecology    PATIENT NAME: Tina Gutierrez  MED RECORD NUMBER:  DATE ADMITTED: 06/27/17  DATE DISCHARGED: 06/28/17  ADMITTING DIAGNOSIS: Vaginal Bleeding with anemia  DISCHARGE DIAGNOSIS: Abnormal uterine bleeding  SECONDARY DIAGNOSIS: Anemia requiring 2 units of blood transfusion  BRIEF HOSPITAL COURSE: Stable   PHYSICAL EXAM:  Gen: A,A&Ox3 HEENT: Normocephalic, Eyes non-icteric. Cardiac: S1S2, RRR, No M/R/G. Lungs: CTA bilat, no W/R/R. ABD: , +BS 4 quads, no organomegaly, no hernia Extrems: MAE well, no edema, peripheral pulses intact.   Pad: mod bleeding with no clots  VITAL SIGNS: Wt Readings from Last 3 Encounters:  06/27/17 190 lb (86.2 kg)  09/28/13 160 lb (72.6 kg)  03/30/16 145 lb (65.8 kg)   Temp Readings from Last 3 Encounters:  06/28/17 98 F (36.7 C) (Oral)  03/30/16 98.9 F (37.2 C) (Oral)   BP Readings from Last 3 Encounters:  06/28/17 (!) 101/59  09/28/13 116/74  03/30/16 103/72   Pulse Readings from Last 3 Encounters:  06/28/17 (!) 59  09/28/13 76  03/30/16 81    LABS:  Lab Results  Component Value Date   WBC 8.3 06/27/2017   HGB 10.0 (L) 06/28/2017   HCT 31.4 (L) 06/28/2017   MCV 56.7 (L) 06/27/2017   PLT 464 (H) 06/27/2017    OUTPATIENT FU: 1 week with MD at [facility] w/Dr Ward of Dr Azucena Fallen DISPOSTION: Home   Dr Dalbert Garnet agrees with plan of care.  Take Iron supplement to treat the anemia. Pt will continue Ortho-Evra as she cannot tolerate the pill.  __________________________________  Myrtie Cruise, MSN, CNM, FNP Certified Nurse Midwife [facility]                  Note shared with patient

## 2017-06-30 LAB — COAG STUDIES INTERP REPORT

## 2017-06-30 LAB — VON WILLEBRAND PANEL
Coagulation Factor VIII: 199 % — ABNORMAL HIGH (ref 57–163)
RISTOCETIN CO-FACTOR, PLASMA: 110 % (ref 50–200)
VON WILLEBRAND ANTIGEN, PLASMA: 114 % (ref 50–200)

## 2018-01-19 ENCOUNTER — Encounter: Payer: Self-pay | Admitting: Emergency Medicine

## 2018-01-19 ENCOUNTER — Other Ambulatory Visit: Payer: Self-pay

## 2018-01-19 ENCOUNTER — Emergency Department
Admission: EM | Admit: 2018-01-19 | Discharge: 2018-01-19 | Disposition: A | Discharge status: Home / Self Care | Payer: Medicaid Other | Attending: Emergency Medicine | Admitting: Emergency Medicine

## 2018-01-19 DIAGNOSIS — R0981 Nasal congestion: Secondary | ICD-10-CM | POA: Insufficient documentation

## 2018-01-19 DIAGNOSIS — R509 Fever, unspecified: Secondary | ICD-10-CM | POA: Insufficient documentation

## 2018-01-19 DIAGNOSIS — R05 Cough: Secondary | ICD-10-CM | POA: Insufficient documentation

## 2018-01-19 DIAGNOSIS — N764 Abscess of vulva: Secondary | ICD-10-CM | POA: Insufficient documentation

## 2018-01-19 LAB — URINALYSIS, COMPLETE (UACMP) WITH MICROSCOPIC
Bacteria, UA: NONE SEEN
Bilirubin Urine: NEGATIVE
GLUCOSE, UA: NEGATIVE mg/dL
KETONES UR: NEGATIVE mg/dL
Leukocytes, UA: NEGATIVE
Nitrite: NEGATIVE
PH: 6 (ref 5.0–8.0)
PROTEIN: NEGATIVE mg/dL
Specific Gravity, Urine: 1.012 (ref 1.005–1.030)

## 2018-01-19 LAB — CBC WITH DIFFERENTIAL/PLATELET
ABS IMMATURE GRANULOCYTES: 0.06 10*3/uL (ref 0.00–0.07)
BASOS ABS: 0 10*3/uL (ref 0.0–0.1)
BASOS PCT: 0 %
EOS ABS: 0.2 10*3/uL (ref 0.0–0.5)
Eosinophils Relative: 2 %
HCT: 35.3 % — ABNORMAL LOW (ref 36.0–46.0)
Hemoglobin: 10.9 g/dL — ABNORMAL LOW (ref 12.0–15.0)
IMMATURE GRANULOCYTES: 1 %
LYMPHS ABS: 2.9 10*3/uL (ref 0.7–4.0)
Lymphocytes Relative: 30 %
MCH: 23.6 pg — ABNORMAL LOW (ref 26.0–34.0)
MCHC: 30.9 g/dL (ref 30.0–36.0)
MCV: 76.4 fL — ABNORMAL LOW (ref 80.0–100.0)
Monocytes Absolute: 0.4 10*3/uL (ref 0.1–1.0)
Monocytes Relative: 4 %
NEUTROS ABS: 6.1 10*3/uL (ref 1.7–7.7)
NEUTROS PCT: 63 %
NRBC: 0 % (ref 0.0–0.2)
PLATELETS: 443 10*3/uL — AB (ref 150–400)
RBC: 4.62 MIL/uL (ref 3.87–5.11)
RDW: 14.5 % (ref 11.5–15.5)
WBC: 9.7 10*3/uL (ref 4.0–10.5)

## 2018-01-19 LAB — POCT PREGNANCY, URINE: Preg Test, Ur: NEGATIVE

## 2018-01-19 MED ORDER — ACETAMINOPHEN 500 MG PO TABS
1000.0000 mg | ORAL_TABLET | Freq: Once | ORAL | Status: AC
  Administered 2018-01-19: 1000 mg via ORAL
  Filled 2018-01-19: qty 2

## 2018-01-19 MED ORDER — SULFAMETHOXAZOLE-TRIMETHOPRIM 800-160 MG PO TABS: 1.0000 | ORAL_TABLET | Freq: Two times a day (BID) | ORAL | 0 refills | Status: AC

## 2018-01-19 MED ORDER — ONDANSETRON HCL 4 MG/2ML IJ SOLN
INTRAMUSCULAR | Status: AC
  Filled 2018-01-19: qty 2

## 2018-01-19 MED ORDER — OXYCODONE HCL 5 MG PO TABS
5.0000 mg | ORAL_TABLET | Freq: Once | ORAL | Status: AC
  Administered 2018-01-19: 5 mg via ORAL
  Filled 2018-01-19: qty 1

## 2018-01-19 MED ORDER — OXYCODONE-ACETAMINOPHEN 5-325 MG PO TABS: 1.0000 | ORAL_TABLET | ORAL | 0 refills | Status: DC | PRN

## 2018-01-19 MED ORDER — KETAMINE HCL 10 MG/ML IJ SOLN
INTRAMUSCULAR | Status: AC
  Administered 2018-01-19: 73 mg via INTRAVENOUS
  Filled 2018-01-19: qty 1

## 2018-01-19 MED ORDER — ONDANSETRON HCL 4 MG/2ML IJ SOLN
INTRAMUSCULAR | Status: AC | PRN
  Administered 2018-01-19: 4 mg via INTRAVENOUS

## 2018-01-19 MED ORDER — LIDOCAINE HCL (PF) 1 % IJ SOLN
10.0000 mL | Freq: Once | INTRAMUSCULAR | Status: AC
  Administered 2018-01-19: 10 mL via INTRADERMAL
  Filled 2018-01-19: qty 10

## 2018-01-19 MED ORDER — KETAMINE HCL 10 MG/ML IJ SOLN
1.0000 mg/kg | Freq: Once | INTRAMUSCULAR | Status: AC
  Administered 2018-01-19: 73 mg via INTRAVENOUS

## 2018-01-19 MED ORDER — LIDOCAINE-PRILOCAINE 2.5-2.5 % EX CREA: TOPICAL_CREAM | Freq: Once | CUTANEOUS | Status: DC

## 2018-01-19 MED ORDER — CLINDAMYCIN PHOSPHATE 600 MG/50ML IV SOLN
600.0000 mg | Freq: Once | INTRAVENOUS | Status: AC
  Administered 2018-01-19: 600 mg via INTRAVENOUS
  Filled 2018-01-19: qty 50

## 2018-01-19 NOTE — ED Provider Notes (Signed)
[facility] Emergency Department Provider Note  ____________________________________________  Time seen: Approximately 11:45 AM  I have reviewed the triage vital signs and the nursing notes.   HISTORY  Chief Complaint Vaginal Pain   HPI Tina Gutierrez is a 27 y.o. female with no significant past medical history who presents for evaluation of vaginal pain.  Patient reports that she first noticed a nodule in her right labia 4 days ago while attempting to shave the area.  That has grown considerably since.  She is complaining of constant pain that is sharp and severe worse when she walks or sits now for the last 2 days.  She has had a fever intermittently over the last week however she also has had a cough and congestion.  She denies vaginal discharge, abdominal pain, vaginal bleeding, chest pain or shortness of breath. No prior h/o abscess.   Past Medical History:  Diagnosis Date  . Anemia   . Ovarian cyst     Patient Active Problem List   Diagnosis Date Noted  . Abnormal uterine bleeding 06/27/2017    Past Surgical History:  Procedure Laterality Date  . CHOLECYSTECTOMY  01/2012   [facility]    Prior to Admission medications   Medication Sig Start Date End Date Taking? Authorizing Provider  oxyCODONE-acetaminophen (PERCOCET) 5-325 MG tablet Take 1 tablet by mouth every 4 (four) hours as needed for severe pain. 01/19/18   Nita SickleVeronese, [place], MD  sulfamethoxazole-trimethoprim (BACTRIM DS,SEPTRA DS) 800-160 MG tablet Take 1 tablet by mouth 2 (two) times daily for 7 days. 01/19/18 01/26/18  Nita SickleVeronese, [place], MD    Allergies Other  Family History  Problem Relation Age of Onset  . Diabetes Maternal Grandfather        TYPE 2    Social History Social History   Tobacco Use  . Smoking status: Never Smoker  . Smokeless tobacco: Never Used  Substance Use Topics  . Alcohol use: No  . Drug use: Never    Review of Systems  Constitutional: +  fever. Eyes: Negative for visual changes. ENT: Negative for sore throat. Neck: No neck pain  Cardiovascular: Negative for chest pain. Respiratory: Negative for shortness of breath. + cough Gastrointestinal: Negative for abdominal pain, vomiting or diarrhea. Genitourinary: Negative for dysuria.+ vaginal pain Musculoskeletal: Negative for back pain. Skin: Negative for rash. Neurological: Negative for headaches, weakness or numbness. Psych: No SI or HI  ____________________________________________   PHYSICAL EXAM:  VITAL SIGNS: ED Triage Vitals  Enc Vitals Group     BP 01/19/18 1007 129/85     Pulse Rate 01/19/18 1007 (!) 110     Resp 01/19/18 1007 18     Temp 01/19/18 1007 97.9 F (36.6 C)     Temp Source 01/19/18 1007 Oral     SpO2 01/19/18 1007 99 %     Weight 01/19/18 0957 160 lb (72.6 kg)     Height 01/19/18 0957 5\' 2"  (1.575 m)     Head Circumference --      Peak Flow --      Pain Score 01/19/18 0957 9     Pain Loc --      Pain Edu? --      Excl. in GC? --     Constitutional: Alert and oriented. Well appearing and in no apparent distress. HEENT:      Head: Normocephalic and atraumatic.         Eyes: Conjunctivae are normal. Sclera is non-icteric.  Mouth/Throat: Mucous membranes are moist.       Neck: Supple with no signs of meningismus. Cardiovascular: Tachycardic with regular rhythm. No murmurs, gallops, or rubs.  Respiratory: Normal respiratory effort. Lungs are clear to auscultation bilaterally. No wheezes, crackles, or rhonchi.  Gastrointestinal: Soft, non tender, and non distended with positive bowel sounds. No rebound or guarding. Genitourinary: Large R outer labia abscess with minimal overlying cellulitis Musculoskeletal: Nontender with normal range of motion in all extremities. No edema, cyanosis, or erythema of extremities. Neurologic: Normal speech and language. Face is symmetric. Moving all extremities. No gross focal neurologic deficits are  appreciated. Skin: Skin is warm, dry and intact. No rash noted. Psychiatric: Mood and affect are normal. Speech and behavior are normal.  ____________________________________________   LABS (all labs ordered are listed, but only abnormal results are displayed)  Labs Reviewed  URINALYSIS, COMPLETE (UACMP) WITH MICROSCOPIC - Abnormal; Notable for the following components:      Result Value   Color, Urine YELLOW (*)    APPearance HAZY (*)    Hgb urine dipstick SMALL (*)    All other components within normal limits  CBC WITH DIFFERENTIAL/PLATELET - Abnormal; Notable for the following components:   Hemoglobin 10.9 (*)    HCT 35.3 (*)    MCV 76.4 (*)    MCH 23.6 (*)    Platelets 443 (*)    All other components within normal limits  POC URINE PREG, ED  POCT PREGNANCY, URINE   ____________________________________________  EKG  none  ____________________________________________  RADIOLOGY  none  ____________________________________________   PROCEDURES  Procedure(s) performed: yes .Sedation Date/Time: 01/19/2018 2:12 PM Performed by: Nita Sickle, MD Authorized by: Nita Sickle, MD   Consent:    Consent obtained:  Written (electronic informed consent)   Consent given by:  Patient   Risks discussed:  Allergic reaction, dysrhythmia, inadequate sedation, nausea, vomiting, respiratory compromise necessitating ventilatory assistance and intubation, prolonged sedation necessitating reversal and prolonged hypoxia resulting in organ damage   Alternatives discussed:  Analgesia without sedation and anxiolysis Universal protocol:    Procedure explained and questions answered to patient or proxy's satisfaction: yes     Relevant documents present and verified: yes     Test results available and properly labeled: yes     Imaging studies available: yes     Required blood products, implants, devices, and special equipment available: yes     Immediately prior to procedure a  time out was called: yes     Patient identity confirmation method:  Arm band Indications:    Procedure performed:  Incision and drainage   Procedure necessitating sedation performed by:  Physician performing sedation Pre-sedation assessment:    Time since last food or drink:  0900   NPO status caution: urgency dictates proceeding with non-ideal NPO status     ASA classification: class 1 - normal, healthy patient     Neck mobility: normal     Mouth opening:  3 or more finger widths   Thyromental distance:  4 finger widths   Mallampati score:  I - soft palate, uvula, fauces, pillars visible   Pre-sedation assessments completed and reviewed: airway patency, cardiovascular function, hydration status, mental status, nausea/vomiting, pain level, respiratory function and temperature     Pre-sedation assessment completed:  01/19/2018 11:30 AM Immediate pre-procedure details:    Reassessment: Patient reassessed immediately prior to procedure     Reviewed: vital signs, relevant labs/tests and NPO status     Verified: bag valve mask available,  emergency equipment available, intubation equipment available, IV patency confirmed, oxygen available, reversal medications available and suction available   Procedure details (see MAR for exact dosages):    Preoxygenation:  Nasal cannula   Sedation:  Ketamine   Analgesia:  None   Intra-procedure monitoring:  Blood pressure monitoring, continuous pulse oximetry, cardiac monitor, frequent vital sign checks, frequent LOC assessments and continuous capnometry   Intra-procedure events: none     Total Provider sedation time (minutes):  20 Post-procedure details:    Post-sedation assessment completed:  01/19/2018 2:15 PM   Attendance: Constant attendance by certified staff until patient recovered     Recovery: Patient returned to pre-procedure baseline     Post-sedation assessments completed and reviewed: airway patency, cardiovascular function, hydration status,  mental status and respiratory function     Patient is stable for discharge or admission: yes     Patient tolerance:  Tolerated well, no immediate complications .Marland KitchenIncision and Drainage Date/Time: 01/19/2018 2:15 PM Performed by: Nita Sickle, MD Authorized by: Nita Sickle, MD   Consent:    Consent obtained:  Written   Consent given by:  Patient   Risks discussed:  Bleeding, infection, incomplete drainage and pain   Alternatives discussed:  Alternative treatment, delayed treatment and observation Location:    Type:  Abscess   Size:  4   Location:  Anogenital   Anogenital location:  Vulva Pre-procedure details:    Skin preparation:  Betadine Sedation:    Sedation type:  Deep Anesthesia (see MAR for exact dosages):    Anesthesia method:  Local infiltration   Local anesthetic:  Lidocaine 1% w/o epi Procedure type:    Complexity:  Complex Procedure details:    Incision types:  Stab incision   Scalpel blade:  11   Wound management:  Probed and deloculated and irrigated with saline   Drainage:  Purulent   Drainage amount:  Moderate   Wound treatment:  Wound left open Post-procedure details:    Patient tolerance of procedure:  Tolerated well, no immediate complications   Critical Care performed:  None ____________________________________________   INITIAL IMPRESSION / ASSESSMENT AND PLAN / ED COURSE  27 y.o. female with no significant past medical history who presents for evaluation of vaginal pain.  Patient with a large abscess located in the right external labia.  Patient is severely tender in that area.  Due to the size of the abscess, the fact the patient is so tender and at the sensitive location of the abscess I discussed conscious sedation versus awake I&D with patient.  She has opted for conscious sedation.  Abscess was drained under conscious sedation with the use of ketamine per procedure note above.    _________________________ 2:16 PM on  01/19/2018 -----------------------------------------  Patient is back to baseline, ambulating with no difficulty.  Will be discharged home at this time.  Discussed wound care, antibiotic use, and close follow-up.  Recommended return to the emergency room for fever worsening infection.   As part of my medical decision making, I reviewed the following data within the electronic MEDICAL RECORD NUMBER Nursing notes reviewed and incorporated, Labs reviewed , Old chart reviewed, Notes from prior ED visits and [place] Controlled Substance Database    Pertinent labs & imaging results that were available during my care of the patient were reviewed by me and considered in my medical decision making (see chart for details).    ____________________________________________   FINAL CLINICAL IMPRESSION(S) / ED DIAGNOSES  Final diagnoses:  Abscess of right  genital labia      NEW MEDICATIONS STARTED DURING THIS VISIT:  ED Discharge Orders         Ordered    sulfamethoxazole-trimethoprim (BACTRIM DS,SEPTRA DS) 800-160 MG tablet  2 times daily     01/19/18 1411    oxyCODONE-acetaminophen (PERCOCET) 5-325 MG tablet  Every 4 hours PRN     01/19/18 1416           Note:  This document was prepared using Dragon voice recognition software and may include unintentional dictation errors.    Don Perking, [place], MD 01/19/18 [phone]

## 2018-01-19 NOTE — ED Triage Notes (Addendum)
Pt arrived via POV with reports of cyst at the opening of vagina on Thursday. Pt states it has grown and states the pain is worse. Denies any vaginal bleeding or discharge.   Pt states she has had vaginal bleeding since august and states over the weekend the period stopped.

## 2018-01-19 NOTE — ED Notes (Signed)
Lab drawn, unable to advance catheter to leave as IV.

## 2018-01-19 NOTE — ED Notes (Signed)
PT ambulatory by self with steady gate . Slightly "light headed" but pt has been lying down for extended period of time.

## 2018-06-06 ENCOUNTER — Other Ambulatory Visit: Payer: Self-pay

## 2018-06-06 ENCOUNTER — Emergency Department: Payer: Self-pay

## 2018-06-06 ENCOUNTER — Emergency Department
Admission: EM | Admit: 2018-06-06 | Discharge: 2018-06-06 | Disposition: A | Discharge status: Home / Self Care | Payer: Self-pay | Attending: Emergency Medicine | Admitting: Emergency Medicine

## 2018-06-06 DIAGNOSIS — R6884 Jaw pain: Secondary | ICD-10-CM | POA: Insufficient documentation

## 2018-06-06 DIAGNOSIS — Y939 Activity, unspecified: Secondary | ICD-10-CM | POA: Insufficient documentation

## 2018-06-06 DIAGNOSIS — Y999 Unspecified external cause status: Secondary | ICD-10-CM | POA: Insufficient documentation

## 2018-06-06 DIAGNOSIS — R51 Headache: Secondary | ICD-10-CM | POA: Insufficient documentation

## 2018-06-06 DIAGNOSIS — S0083XA Contusion of other part of head, initial encounter: Secondary | ICD-10-CM | POA: Insufficient documentation

## 2018-06-06 DIAGNOSIS — Y9289 Other specified places as the place of occurrence of the external cause: Secondary | ICD-10-CM | POA: Insufficient documentation

## 2018-06-06 DIAGNOSIS — M542 Cervicalgia: Secondary | ICD-10-CM | POA: Insufficient documentation

## 2018-06-06 LAB — URINALYSIS, COMPLETE (UACMP) WITH MICROSCOPIC
Bilirubin Urine: NEGATIVE
Glucose, UA: NEGATIVE mg/dL
Ketones, ur: NEGATIVE mg/dL
Leukocytes,Ua: NEGATIVE
Nitrite: NEGATIVE
Protein, ur: NEGATIVE mg/dL
RBC / HPF: >50 RBC/hpf — ABNORMAL HIGH (ref 0–5)
Specific Gravity, Urine: 1.017 (ref 1.005–1.030)
pH: 5 (ref 5.0–8.0)

## 2018-06-06 LAB — POCT PREGNANCY, URINE: Preg Test, Ur: NEGATIVE

## 2018-06-06 MED ORDER — MELOXICAM 15 MG PO TABS: 15.0000 mg | ORAL_TABLET | Freq: Every day | ORAL | 1 refills | Status: AC

## 2018-06-06 MED ORDER — METHOCARBAMOL 500 MG PO TABS: 500.0000 mg | ORAL_TABLET | Freq: Three times a day (TID) | ORAL | 0 refills | Status: AC | PRN

## 2018-06-06 NOTE — ED Notes (Signed)
Pt to xr 
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
## 2018-11-18 ENCOUNTER — Other Ambulatory Visit: Payer: Self-pay | Admitting: *Deleted

## 2018-11-18 DIAGNOSIS — Z20822 Contact with and (suspected) exposure to covid-19: Secondary | ICD-10-CM

## 2018-11-19 LAB — NOVEL CORONAVIRUS, NAA: SARS-CoV-2, NAA: NOT DETECTED

## 2018-12-07 ENCOUNTER — Emergency Department
Admission: EM | Admit: 2018-12-07 | Discharge: 2018-12-07 | Disposition: A | Discharge status: Home / Self Care | Payer: Self-pay | Attending: Emergency Medicine | Admitting: Emergency Medicine

## 2018-12-07 ENCOUNTER — Other Ambulatory Visit: Payer: Self-pay

## 2018-12-07 DIAGNOSIS — R519 Headache, unspecified: Secondary | ICD-10-CM | POA: Insufficient documentation

## 2018-12-07 DIAGNOSIS — D649 Anemia, unspecified: Secondary | ICD-10-CM | POA: Insufficient documentation

## 2018-12-07 DIAGNOSIS — R030 Elevated blood-pressure reading, without diagnosis of hypertension: Secondary | ICD-10-CM | POA: Insufficient documentation

## 2018-12-07 DIAGNOSIS — I10 Essential (primary) hypertension: Secondary | ICD-10-CM

## 2018-12-07 LAB — BASIC METABOLIC PANEL
Anion gap: 8 (ref 5–15)
BUN: 8 mg/dL (ref 6–20)
CO2: 25 mmol/L (ref 22–32)
Calcium: 9.1 mg/dL (ref 8.9–10.3)
Chloride: 105 mmol/L (ref 98–111)
Creatinine, Ser: 0.56 mg/dL (ref 0.44–1.00)
GFR calc Af Amer: >60 mL/min (ref >60)
GFR calc non Af Amer: >60 mL/min (ref >60)
Glucose, Bld: 106 mg/dL — ABNORMAL HIGH (ref 70–99)
Potassium: 3.9 mmol/L (ref 3.5–5.1)
Sodium: 138 mmol/L (ref 135–145)

## 2018-12-07 LAB — CBC
HCT: 26.7 % — ABNORMAL LOW (ref 36.0–46.0)
Hemoglobin: 6.9 g/dL — ABNORMAL LOW (ref 12.0–15.0)
MCH: 14.9 pg — ABNORMAL LOW (ref 26.0–34.0)
MCHC: 25.8 g/dL — ABNORMAL LOW (ref 30.0–36.0)
MCV: 57.8 fL — ABNORMAL LOW (ref 80.0–100.0)
Platelets: 517 10*3/uL — ABNORMAL HIGH (ref 150–400)
RBC: 4.62 MIL/uL (ref 3.87–5.11)
RDW: 21.2 % — ABNORMAL HIGH (ref 11.5–15.5)
WBC: 9.8 10*3/uL (ref 4.0–10.5)
nRBC: 0.3 % — ABNORMAL HIGH (ref 0.0–0.2)

## 2018-12-07 MED ORDER — BUTALBITAL-APAP-CAFFEINE 50-325-40 MG PO TABS: 1.0000 | ORAL_TABLET | Freq: Four times a day (QID) | ORAL | 0 refills | Status: DC | PRN

## 2018-12-07 MED ORDER — BUTALBITAL-APAP-CAFFEINE 50-325-40 MG PO TABS
1.0000 | ORAL_TABLET | Freq: Once | ORAL | Status: AC
  Administered 2018-12-07: 20:00:00 1 via ORAL
  Filled 2018-12-07: qty 1
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
## 2018-12-07 NOTE — Discharge Instructions (Addendum)
As we discussed please have your blood levels rechecked in the next few days. Please seek medical attention for any high fevers, chest pain, shortness of breath, change in behavior, persistent vomiting, bloody stool or any other new or concerning symptoms.

## 2018-12-07 NOTE — ED Triage Notes (Addendum)
Pt comes via POV from home with c/o headache. Pt states she checked her BP at home and it was high.  Pt states she feels tired a lot here lately too.  Pt states some nausea.  Pt states pain in her head and some blurry vision. Pt states she feels like she hears sirens in her head. Pt states it was a shooting pain in her head.  Pt states her BP-181/75, pt's BP before she came in was 144/82. Pt states when she arrived here her BP was normal.  Pt unsure of what could be causing the pain and BP issues.

## 2019-07-05 ENCOUNTER — Encounter: Payer: Self-pay | Admitting: Emergency Medicine

## 2019-07-05 ENCOUNTER — Emergency Department: Payer: Self-pay

## 2019-07-05 ENCOUNTER — Observation Stay
Admission: EM | Admit: 2019-07-05 | Discharge: 2019-07-06 | Disposition: A | Discharge status: Home / Self Care | Payer: Self-pay | Attending: Obstetrics & Gynecology | Admitting: Obstetrics & Gynecology

## 2019-07-05 ENCOUNTER — Other Ambulatory Visit: Payer: Self-pay

## 2019-07-05 DIAGNOSIS — N939 Abnormal uterine and vaginal bleeding, unspecified: Secondary | ICD-10-CM

## 2019-07-05 DIAGNOSIS — Z793 Long term (current) use of hormonal contraceptives: Secondary | ICD-10-CM | POA: Insufficient documentation

## 2019-07-05 DIAGNOSIS — N921 Excessive and frequent menstruation with irregular cycle: Principal | ICD-10-CM | POA: Diagnosis present

## 2019-07-05 DIAGNOSIS — R9389 Abnormal findings on diagnostic imaging of other specified body structures: Secondary | ICD-10-CM | POA: Insufficient documentation

## 2019-07-05 DIAGNOSIS — D649 Anemia, unspecified: Secondary | ICD-10-CM | POA: Insufficient documentation

## 2019-07-05 DIAGNOSIS — Z20822 Contact with and (suspected) exposure to covid-19: Secondary | ICD-10-CM | POA: Insufficient documentation

## 2019-07-05 DIAGNOSIS — Z91018 Allergy to other foods: Secondary | ICD-10-CM | POA: Insufficient documentation

## 2019-07-05 DIAGNOSIS — Z833 Family history of diabetes mellitus: Secondary | ICD-10-CM | POA: Insufficient documentation

## 2019-07-05 DIAGNOSIS — Z79899 Other long term (current) drug therapy: Secondary | ICD-10-CM | POA: Insufficient documentation

## 2019-07-05 DIAGNOSIS — Z9049 Acquired absence of other specified parts of digestive tract: Secondary | ICD-10-CM | POA: Insufficient documentation

## 2019-07-05 LAB — URINALYSIS, COMPLETE (UACMP) WITH MICROSCOPIC
Bacteria, UA: NONE SEEN
Bilirubin Urine: NEGATIVE
Glucose, UA: NEGATIVE mg/dL
Ketones, ur: NEGATIVE mg/dL
Leukocytes,Ua: NEGATIVE
Nitrite: NEGATIVE
Protein, ur: NEGATIVE mg/dL
Specific Gravity, Urine: 1.026 (ref 1.005–1.030)
pH: 5 (ref 5.0–8.0)

## 2019-07-05 LAB — COMPREHENSIVE METABOLIC PANEL
ALT: 27 U/L (ref 0–44)
AST: 19 U/L (ref 15–41)
Albumin: 4 g/dL (ref 3.5–5.0)
Alkaline Phosphatase: 66 U/L (ref 38–126)
Anion gap: 6 (ref 5–15)
BUN: 12 mg/dL (ref 6–20)
CO2: 23 mmol/L (ref 22–32)
Calcium: 8.9 mg/dL (ref 8.9–10.3)
Chloride: 107 mmol/L (ref 98–111)
Creatinine, Ser: 0.41 mg/dL — ABNORMAL LOW (ref 0.44–1.00)
GFR calc Af Amer: >60 mL/min (ref >60)
GFR calc non Af Amer: >60 mL/min (ref >60)
Glucose, Bld: 106 mg/dL — ABNORMAL HIGH (ref 70–99)
Potassium: 4 mmol/L (ref 3.5–5.1)
Sodium: 136 mmol/L (ref 135–145)
Total Bilirubin: 0.5 mg/dL (ref 0.3–1.2)
Total Protein: 7.8 g/dL (ref 6.5–8.1)

## 2019-07-05 LAB — CBC
HCT: 27.9 % — ABNORMAL LOW (ref 36.0–46.0)
Hemoglobin: 7.3 g/dL — ABNORMAL LOW (ref 12.0–15.0)
MCH: 15.1 pg — ABNORMAL LOW (ref 26.0–34.0)
MCHC: 26.2 g/dL — ABNORMAL LOW (ref 30.0–36.0)
MCV: 57.9 fL — ABNORMAL LOW (ref 80.0–100.0)
Platelets: 660 10*3/uL — ABNORMAL HIGH (ref 150–400)
RBC: 4.82 MIL/uL (ref 3.87–5.11)
RDW: 23.5 % — ABNORMAL HIGH (ref 11.5–15.5)
WBC: 8.3 10*3/uL (ref 4.0–10.5)
nRBC: 0 % (ref 0.0–0.2)

## 2019-07-05 LAB — LIPASE, BLOOD: Lipase: 34 U/L (ref 11–51)

## 2019-07-05 LAB — SARS CORONAVIRUS 2 BY RT PCR (HOSPITAL ORDER, PERFORMED IN ~~LOC~~ HOSPITAL LAB): SARS Coronavirus 2: NEGATIVE

## 2019-07-05 LAB — PREPARE RBC (CROSSMATCH)

## 2019-07-05 LAB — POCT PREGNANCY, URINE: Preg Test, Ur: NEGATIVE

## 2019-07-05 LAB — SAMPLE TO BLOOD BANK

## 2019-07-05 MED ORDER — SODIUM CHLORIDE 0.9 % IV BOLUS: Freq: Once | INTRAVENOUS | Status: AC

## 2019-07-05 MED ORDER — PRENATAL PLUS 27-1 MG PO TABS: 1.0000 | ORAL_TABLET | Freq: Every day | ORAL | Status: DC

## 2019-07-05 MED ORDER — ACETAMINOPHEN 325 MG PO TABS
650.0000 mg | ORAL_TABLET | Freq: Once | ORAL | Status: AC
  Administered 2019-07-05: 650 mg via ORAL
  Filled 2019-07-05: qty 2

## 2019-07-05 MED ORDER — ALUM & MAG HYDROXIDE-SIMETH 200-200-20 MG/5ML PO SUSP: 30.0000 mL | ORAL | Status: DC | PRN

## 2019-07-05 MED ORDER — FUROSEMIDE 10 MG/ML IJ SOLN
20.0000 mg | Freq: Once | INTRAMUSCULAR | Status: AC
  Administered 2019-07-05: 20 mg via INTRAVENOUS
  Filled 2019-07-05: qty 2

## 2019-07-05 MED ORDER — LACTATED RINGERS IV SOLN: INTRAVENOUS | Status: DC

## 2019-07-05 MED ORDER — TRANEXAMIC ACID 650 MG PO TABS
1300.0000 mg | ORAL_TABLET | Freq: Three times a day (TID) | ORAL | Status: DC
  Administered 2019-07-05 – 2019-07-06 (×2): 1300 mg via ORAL
  Filled 2019-07-05 (×6): qty 2

## 2019-07-05 MED ORDER — ACETAMINOPHEN 325 MG PO TABS
650.0000 mg | ORAL_TABLET | ORAL | Status: DC | PRN
  Administered 2019-07-05 – 2019-07-06 (×2): 650 mg via ORAL
  Filled 2019-07-05 (×2): qty 2

## 2019-07-05 MED ORDER — DIPHENHYDRAMINE HCL 25 MG PO CAPS
25.0000 mg | ORAL_CAPSULE | Freq: Once | ORAL | Status: AC
  Administered 2019-07-05: 25 mg via ORAL
  Filled 2019-07-05: qty 1

## 2019-07-05 MED ORDER — MEDROXYPROGESTERONE ACETATE 2.5 MG PO TABS
20.0000 mg | ORAL_TABLET | Freq: Three times a day (TID) | ORAL | Status: DC
  Administered 2019-07-05 – 2019-07-06 (×2): 20 mg via ORAL
  Filled 2019-07-05 (×3): qty 8
  Filled 2019-07-05: qty 2
  Filled 2019-07-05 (×2): qty 8
  Filled 2019-07-05: qty 2

## 2019-07-05 NOTE — ED Notes (Signed)
US at bedside at this time 

## 2019-07-05 NOTE — ED Notes (Signed)
Blood bank made aware order for Type and Screen placed by EDP.
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
## 2019-07-05 NOTE — ED Provider Notes (Signed)
[facility] Emergency Department Provider Note  Time seen: 2:20 PM  I have reviewed the triage vital signs and the nursing notes.   HISTORY  Chief Complaint Vaginal Bleeding   HPI Tina Gutierrez is a 29 y.o. female with a past medical history of anemia, presents to the emergency department for vaginal bleeding.  According to the patient for the past 4 months or so she has had persistent vaginal bleeding, states over the last week or so it has become more heavy.  Was experiencing increased lower abdominal pain/cramping this morning but then passed a large clot and has not had any pain since.  Patient states a history of this occurring previously, per record review in 2019 was admitted for similar presentation requiring blood transfusion.  Patient states over the last few weeks she has become weak/fatigued and short of breath especially with any type of exertion.   Past Medical History:  Diagnosis Date  . Anemia   . Ovarian cyst     Patient Active Problem List   Diagnosis Date Noted  . Abnormal uterine bleeding 06/27/2017    Past Surgical History:  Procedure Laterality Date  . CHOLECYSTECTOMY  01/2012   [facility]    Prior to Admission medications   Medication Sig Start Date End Date Taking? Authorizing Provider  butalbital-acetaminophen-caffeine (FIORICET) 50-325-40 MG tablet Take 1-2 tablets by mouth every 6 (six) hours as needed for headache. 12/07/18 12/07/19  Nance Pear, MD  oxyCODONE-acetaminophen (PERCOCET) 5-325 MG tablet Take 1 tablet by mouth every 4 (four) hours as needed for severe pain. 01/19/18   Rudene Re, MD    Allergies  Allergen Reactions  . Other Itching    Pistachios = severe swelling, walnuts, bananas, apples = itching    Family History  Problem Relation Age of Onset  . Diabetes Maternal Grandfather        TYPE 2    Social History Social History   Tobacco Use  . Smoking status: Never Smoker  . Smokeless tobacco:  Never Used  Substance Use Topics  . Alcohol use: No  . Drug use: Never    Review of Systems Constitutional: Negative for fever. Cardiovascular: Negative for chest pain. Respiratory: Negative for shortness of breath. Gastrointestinal: Admits lower abdominal pain/cramping.  None currently. Genitourinary: Positive for vaginal bleeding x4 months. Musculoskeletal: Negative for musculoskeletal complaints Neurological: Negative for headache All other ROS negative  ____________________________________________   PHYSICAL EXAM:  VITAL SIGNS: ED Triage Vitals  Enc Vitals Group     BP 07/05/19 1155 132/80     Pulse Rate 07/05/19 1155 85     Resp 07/05/19 1155 16     Temp 07/05/19 1155 98.4 F (36.9 C)     Temp Source 07/05/19 1155 Oral     SpO2 07/05/19 1155 100 %     Weight 07/05/19 1156 194 lb (88 kg)     Height 07/05/19 1156 5\' 1"  (1.549 m)     Head Circumference --      Peak Flow --      Pain Score 07/05/19 1156 0     Pain Loc --      Pain Edu? --      Excl. in [place]? --    Constitutional: Alert and oriented. Well appearing and in no distress. Eyes: Normal exam ENT      Head: Normocephalic and atraumatic.      Mouth/Throat: Mucous membranes are moist. Cardiovascular: Normal rate, regular rhythm Respiratory: Normal respiratory effort without tachypnea  nor retractions. Breath sounds are clear  Gastrointestinal: Soft and nontender. No distention.   Musculoskeletal: Nontender with normal range of motion in all extremities.  Neurologic:  Normal speech and language. No gross focal neurologic deficits  Skin:  Skin is warm, dry and intact.  Psychiatric: Mood and affect are normal.  ____________________________________________    RADIOLOGY  Ultrasound shows borderline endometrial thickening of 15 mm.  ____________________________________________   INITIAL IMPRESSION / ASSESSMENT AND PLAN / ED COURSE  Pertinent labs & imaging results that were available during my care of  the patient were reviewed by me and considered in my medical decision making (see chart for details).   Patient presents to the emergency department for vaginal bleeding x4 months along with weakness/fatigue and shortness of breath especially with exertion.  Patient is work-up so she is not pregnant but her hemoglobin is down to 7.3 similar to prior presentation.  Patient will likely require blood transfusion.  Patient states she sees OB at [facility] we will discussed with unassigned OB/GYN for possible admission and transfusion.  We will obtain a transvaginal ultrasound to rule out acute abnormality.  Ultrasound shows endometrial thickening 50 mm.  Patient's lab work shows a hemoglobin of 7.3.  Spoke to Dr. Tiburcio Pea regarding the patient he will be admitting to his service for blood transfusion and further monitoring.  Patient agreeable to plan of care.  Tina Gutierrez was evaluated in Emergency Department on 07/05/2019 for the symptoms described in the history of present illness. She was evaluated in the context of the global COVID-19 pandemic, which necessitated consideration that the patient might be at risk for infection with the SARS-CoV-2 virus that causes COVID-19. Institutional protocols and algorithms that pertain to the evaluation of patients at risk for COVID-19 are in a state of rapid change based on information released by regulatory bodies including the CDC and federal and state organizations. These policies and algorithms were followed during the patient's care in the ED.  ____________________________________________   FINAL CLINICAL IMPRESSION(S) / ED DIAGNOSES  Vaginal bleeding Symptomatic anemia   Minna Antis, MD 07/05/19 [phone]

## 2019-07-05 NOTE — H&P (Signed)
Obstetrics & Gynecology History and Physical Note  Date of Consultation: 07/05/2019   Requesting Provider: [facility] ER Primary OBGYN: None Primary Care Provider: [facility]  Reason for Consultation: Heavy vaginal bleeding  History of Present Illness: Tina Gutierrez is a 29 y.o. G0P0000 (No LMP recorded. (Menstrual status: Irregular Periods).), with the above CC. She reports always having irregular bleeding for her periods, but more recently has been bleeding for 4 months almost daily.  She has periods even within this dail bleeding, then lingering bleeding throughout the month.  She has clots.  She recently has dizziness, fatugue, and not feeling right.  Came to ER w low hemoglobin noted today.  She has been transfused in past for similar circumstances.  She has been tried on OCP in past that did not change bleeding pattern, in fact she feels it made it worse.  No desire for pregnancy soon, but someday.  No other medical disorder that precludes hormone therapy.  ROS: A review of systems was performed and was complete and comprehensive, except as stated in the above HPI.  OBGYN History: As per HPI. OB History    Gravida  0   Para  0   Term  0   Preterm  0   AB  0   Living  0     SAB  0   TAB  0   Ectopic  0   Multiple  0   Live Births  0            Past Medical History: Past Medical History:  Diagnosis Date  . Anemia   . Ovarian cyst     Past Surgical History: Past Surgical History:  Procedure Laterality Date  . CHOLECYSTECTOMY  01/2012   [facility]    Family History:  Family History  Problem Relation Age of Onset  . Diabetes Maternal Grandfather        TYPE 2   She denies any female cancers, bleeding or blood clotting disorders.   Social History:  Social History   Socioeconomic History  . Marital status: Single    Spouse name: Not on file  . Number of children: 0  . Years of education: 54  . Highest education level: Not on file   Occupational History  . Not on file  Tobacco Use  . Smoking status: Never Smoker  . Smokeless tobacco: Never Used  Substance and Sexual Activity  . Alcohol use: No  . Drug use: Never  . Sexual activity: Yes    Birth control/protection: Pill  Other Topics Concern  . Not on file  Social History Narrative  . Not on file   Social Determinants of Health   Financial Resource Strain:   . Difficulty of Paying Living Expenses:   Food Insecurity:   . Worried About Charity fundraiser in the Last Year:   . Arboriculturist in the Last Year:   Transportation Needs:   . Film/video editor (Medical):   Marland Kitchen Lack of Transportation (Non-Medical):   Physical Activity:   . Days of Exercise per Week:   . Minutes of Exercise per Session:   Stress:   . Feeling of Stress :   Social Connections:   . Frequency of Communication with Friends and Family:   . Frequency of Social Gatherings with Friends and Family:   . Attends Religious Services:   . Active Member of Clubs or Organizations:   . Attends Archivist Meetings:   .  Marital Status:   Intimate Partner Violence:   . Fear of Current or Ex-Partner:   . Emotionally Abused:   Marland Kitchen Physically Abused:   . Sexually Abused:     Allergy: Allergies  Allergen Reactions  . Other Itching    Pistachios = severe swelling, walnuts, bananas, apples = itching    Current Outpatient Medications: (Not in a hospital admission)   Hospital Medications: Current Facility-Administered Medications  Medication Dose Route Frequency Provider Last Rate Last Admin  . 0.9 %  sodium chloride infusion (Manually program via Guardrails IV Fluids)   Intravenous Once Nadara Mustard, MD      . acetaminophen (TYLENOL) tablet 650 mg  650 mg Oral Q4H PRN Nadara Mustard, MD      . acetaminophen (TYLENOL) tablet 650 mg  650 mg Oral Once Nadara Mustard, MD      . alum & mag hydroxide-simeth (MAALOX/MYLANTA) 200-200-20 MG/5ML suspension 30 mL  30 mL Oral Q4H  PRN Nadara Mustard, MD      . diphenhydrAMINE (BENADRYL) capsule 25 mg  25 mg Oral Once Nadara Mustard, MD      . furosemide (LASIX) injection 20 mg  20 mg Intravenous Once Nadara Mustard, MD      . lactated ringers infusion   Intravenous Continuous Nadara Mustard, MD      . medroxyPROGESTERone (PROVERA) tablet 20 mg  20 mg Oral TID Nadara Mustard, MD      . Melene Muller ON 07/06/2019] prenatal multivitamin tablet 1 tablet  1 tablet Oral Q1200 Nadara Mustard, MD      . tranexamic acid (LYSTEDA) tablet 1,300 mg  1,300 mg Oral TID Nadara Mustard, MD       No current outpatient medications on file.    Physical Exam: Vitals:   07/05/19 1155 07/05/19 1156 07/05/19 1433  BP: 132/80  109/71  Pulse: 85  73  Resp: 16  15  Temp: 98.4 F (36.9 C)    TempSrc: Oral    SpO2: 100%  100%  Weight:  88 kg   Height:  5\' 1"  (1.549 m)     Temp:  [98.4 F (36.9 C)] 98.4 F (36.9 C) (05/24 1155) Pulse Rate:  [73-85] 73 (05/24 1433) Resp:  [15-16] 15 (05/24 1433) BP: (109-132)/(71-80) 109/71 (05/24 1433) SpO2:  [100 %] 100 % (05/24 1433) Weight:  [88 kg] 88 kg (05/24 1156) No intake/output data recorded. No intake/output data recorded. No intake or output data in the 24 hours ending 07/05/19 1719  Body mass index is 36.66 kg/m. Constitutional: Well nourished, well developed female in no acute distress.  HEENT: normal Neck:  Supple, normal appearance, and no thyromegaly  Cardiovascular:Regular rate and rhythm.  No murmurs, rubs or gallops. Respiratory:  Clear to auscultation bilateral. Normal respiratory effort Abdomen: positive bowel sounds and no masses, hernias; diffusely non tender to palpation, non distended Neuro: grossly intact Psych:  Normal mood and affect.  Skin:  Warm and dry.  MS: normal gait and normal bilateral lower extremity strength/ROM/symmetry Lymphatic:  No inguinal lymphadenopathy.   Recent Labs  Lab 07/05/19 1203  WBC 8.3  HGB 7.3*  HCT 27.9*  PLT 660*    Recent Labs  Lab 07/05/19 1203  NA 136  K 4.0  CL 107  CO2 23  BUN 12  CREATININE 0.41*  CALCIUM 8.9  PROT 7.8  BILITOT 0.5  ALKPHOS 66  ALT 27  AST 19  GLUCOSE 106*   Recent  Labs  Lab 07/05/19 1424  ABORH A POS    Imaging:  Ultrasound independently reviewed/interpreted by self. No mass or lesions  Assessment: Tina Gutierrez is a 29 y.o. G0P0000 (No LMP recorded. (Menstrual status: Irregular Periods).) who presented to the ED with complaints of symptoms of anemia and associated with prolonged bleeding; findings are consistent with Menometrorrhagia.  Plan: Provera 20 mg TID and Lysteda TID    Short term stoppage and control Blood transfusion 2 U, w appropriate meds and monitoring    Risks and benefits of blood transfusion counseled to patient Long term control options discussed as well, and will be decided upon prior to discharge but also w follow up in office.  Depo vs IUD discussed as best options for her.  Annamarie Major, MD, [facility], [facility] 07/05/2019  5:19 PM Pager [phone]

## 2019-07-06 ENCOUNTER — Telehealth: Payer: Self-pay | Admitting: Obstetrics & Gynecology

## 2019-07-06 LAB — TYPE AND SCREEN
ABO/RH(D): A POS
Antibody Screen: NEGATIVE
Unit division: 0
Unit division: 0

## 2019-07-06 LAB — CBC
HCT: 33.4 % — ABNORMAL LOW (ref 36.0–46.0)
Hemoglobin: 9.8 g/dL — ABNORMAL LOW (ref 12.0–15.0)
MCH: 18.3 pg — ABNORMAL LOW (ref 26.0–34.0)
MCHC: 29.3 g/dL — ABNORMAL LOW (ref 30.0–36.0)
MCV: 62.3 fL — ABNORMAL LOW (ref 80.0–100.0)
Platelets: 567 10*3/uL — ABNORMAL HIGH (ref 150–400)
RBC: 5.36 MIL/uL — ABNORMAL HIGH (ref 3.87–5.11)
RDW: 28.5 % — ABNORMAL HIGH (ref 11.5–15.5)
WBC: 10.4 10*3/uL (ref 4.0–10.5)
nRBC: 0.2 % (ref 0.0–0.2)

## 2019-07-06 LAB — BPAM RBC
Blood Product Expiration Date: 202106042359
Blood Product Expiration Date: 202106072359
ISSUE DATE / TIME: 202105241812
ISSUE DATE / TIME: 202105242102
Unit Type and Rh: 600
Unit Type and Rh: 6200

## 2019-07-06 MED ORDER — MEDROXYPROGESTERONE ACETATE 10 MG PO TABS: 20.0000 mg | ORAL_TABLET | Freq: Every day | ORAL | 1 refills | Status: DC

## 2019-07-06 MED ORDER — TRANEXAMIC ACID 650 MG PO TABS: 1300.0000 mg | ORAL_TABLET | Freq: Three times a day (TID) | ORAL | 1 refills | Status: AC

## 2019-07-06 NOTE — Progress Notes (Signed)
Pt discharged home.  Discharge instructions, prescriptions and follow up appointment given to and reviewed with pt.  Pt verbalized understanding.  Escorted by auxillary. 

## 2019-07-06 NOTE — Discharge Instructions (Signed)
Abnormal Uterine Bleeding Abnormal uterine bleeding means bleeding more than usual from your uterus. It can include:  Bleeding between periods.  Bleeding after sex.  Bleeding that is heavier than normal.  Periods that last longer than usual.  Bleeding after you have stopped having your period (menopause). There are many problems that may cause this. You should see a doctor for any kind of bleeding that is not normal. Treatment depends on the cause of the bleeding. Follow these instructions at home:  Watch your condition for any changes.  Do not use tampons, douche, or have sex, if your doctor tells you not to.  Change your pads often.  Get regular well-woman exams. Make sure they include a pelvic exam and cervical cancer screening.  Keep all follow-up visits as told by your doctor. This is important. Contact a doctor if:  The bleeding lasts more than one week.  You feel dizzy at times.  You feel like you are going to throw up (nauseous).  You throw up. Get help right away if:  You pass out.  You have to change pads every hour.  You have belly (abdominal) pain.  You have a fever.  You get sweaty.  You get weak.  You passing large blood clots from your vagina. Summary  Abnormal uterine bleeding means bleeding more than usual from your uterus.  There are many problems that may cause this. You should see a doctor for any kind of bleeding that is not normal.  Treatment depends on the cause of the bleeding. This information is not intended to replace advice given to you by your health care provider. Make sure you discuss any questions you have with your health care provider. Document Revised: 01/23/2016 Document Reviewed: 01/23/2016 Elsevier Patient Education  [place]. Levonorgestrel intrauterine device (IUD) What is this medicine? LEVONORGESTREL IUD (LEE voe nor jes trel) is a contraceptive (birth control) device. The device is placed inside the  uterus by a healthcare professional. It is used to prevent pregnancy. This device can also be used to treat heavy bleeding that occurs during your period. This medicine may be used for other purposes; ask your health care provider or pharmacist if you have questions. COMMON BRAND NAME(S): Minette Headland What should I tell my health care provider before I take this medicine? They need to know if you have any of these conditions:  abnormal Pap smear  cancer of the breast, uterus, or cervix  diabetes  endometritis  genital or pelvic infection now or in the past  have more than one sexual partner or your partner has more than one partner  heart disease  history of an ectopic or tubal pregnancy  immune system problems  IUD in place  liver disease or tumor  problems with blood clots or take blood-thinners  seizures  use intravenous drugs  uterus of unusual shape  vaginal bleeding that has not been explained  an unusual or allergic reaction to levonorgestrel, other hormones, silicone, or polyethylene, medicines, foods, dyes, or preservatives  pregnant or trying to get pregnant  breast-feeding How should I use this medicine? This device is placed inside the uterus by a health care professional. Talk to your pediatrician regarding the use of this medicine in children. Special care may be needed. Overdosage: If you think you have taken too much of this medicine contact a poison control center or emergency room at once. NOTE: This medicine is only for you. Do not share this medicine with  others. What if I miss a dose? This does not apply. Depending on the brand of device you have inserted, the device will need to be replaced every 3 to 6 years if you wish to continue using this type of birth control. What may interact with this medicine? Do not take this medicine with any of the following medications:  amprenavir  bosentan  fosamprenavir This medicine  may also interact with the following medications:  aprepitant  armodafinil  barbiturate medicines for inducing sleep or treating seizures  bexarotene  boceprevir  griseofulvin  medicines to treat seizures like carbamazepine, ethotoin, felbamate, oxcarbazepine, phenytoin, topiramate  modafinil  pioglitazone  rifabutin  rifampin  rifapentine  some medicines to treat HIV infection like atazanavir, efavirenz, indinavir, lopinavir, nelfinavir, tipranavir, ritonavir  St. John's wort  warfarin This list may not describe all possible interactions. Give your health care provider a list of all the medicines, herbs, non-prescription drugs, or dietary supplements you use. Also tell them if you smoke, drink alcohol, or use illegal drugs. Some items may interact with your medicine. What should I watch for while using this medicine? Visit your doctor or health care professional for regular check ups. See your doctor if you or your partner has sexual contact with others, becomes HIV positive, or gets a sexual transmitted disease. This product does not protect you against HIV infection (AIDS) or other sexually transmitted diseases. You can check the placement of the IUD yourself by reaching up to the top of your vagina with clean fingers to feel the threads. Do not pull on the threads. It is a good habit to check placement after each menstrual period. Call your doctor right away if you feel more of the IUD than just the threads or if you cannot feel the threads at all. The IUD may come out by itself. You may become pregnant if the device comes out. If you notice that the IUD has come out use a backup birth control method like condoms and call your health care provider. Using tampons will not change the position of the IUD and are okay to use during your period. This IUD can be safely scanned with magnetic resonance imaging (MRI) only under specific conditions. Before you have an MRI, tell your  healthcare provider that you have an IUD in place, and which type of IUD you have in place. What side effects may I notice from receiving this medicine? Side effects that you should report to your doctor or health care professional as soon as possible:  allergic reactions like skin rash, itching or hives, swelling of the face, lips, or tongue  fever, flu-like symptoms  genital sores  high blood pressure  no menstrual period for 6 weeks during use  pain, swelling, warmth in the leg  pelvic pain or tenderness  severe or sudden headache  signs of pregnancy  stomach cramping  sudden shortness of breath  trouble with balance, talking, or walking  unusual vaginal bleeding, discharge  yellowing of the eyes or skin Side effects that usually do not require medical attention (report to your doctor or health care professional if they continue or are bothersome):  acne  breast pain  change in sex drive or performance  changes in weight  cramping, dizziness, or faintness while the device is being inserted  headache  irregular menstrual bleeding within first 3 to 6 months of use  nausea This list may not describe all possible side effects. Call your doctor for medical advice about  side effects. You may report side effects to FDA at 1-800-FDA-1088. Where should I keep my medicine? This does not apply. NOTE: This sheet is a summary. It may not cover all possible information. If you have questions about this medicine, talk to your doctor, pharmacist, or health care provider.  2020 Elsevier/Gold Standard (2017-12-09 13:22:01)

## 2019-07-06 NOTE — Telephone Encounter (Signed)
-----   Message from Nadara Mustard, MD sent at 07/06/2019  9:38 AM EDT ----- Regarding: Sch appt Needs appt w me next week, for ER f/u and IUD

## 2019-07-06 NOTE — Telephone Encounter (Signed)
Called and left voicemail for patient to call back to be scheduled. 

## 2019-07-06 NOTE — Telephone Encounter (Signed)
Patient is scheduled for 07/15/19 with [facility]. Patient doesn't have insurance and I do not have a prcie quote for the device she is need. Pending review. Patient states she will try to apply for medicaid.

## 2019-07-06 NOTE — Telephone Encounter (Signed)
Patient needs kyleena placed

## 2019-07-06 NOTE — Progress Notes (Signed)
[facility] [address] [phone] [place] [address] Phone: [phone] Fax: [phone]  Jul 06, 2019  Patient: Tina Gutierrez  Date of Birth: 1990/11/30  Date of Visit: 07/05/2019    To Whom It May Concern:  Tezra Baez was seen and treated in our Hospital on 07/05/2019 to 07/06/2019. [name]  may return to work on 07/07/2019.  Sincerely,  Annamarie Major, MD [facility]

## 2019-07-06 NOTE — Discharge Summary (Signed)
Physician Discharge Summary  Patient ID: Tina Gutierrez MRN: [id number] DOB/AGE: 1990-06-10 29 y.o.  Admit date: 07/05/2019 Discharge date: 07/06/2019  Admission Diagnoses: Menometrorrhagia, Anemia  Discharge Diagnoses:  Active Problems:   Menometrorrhagia   Anemia  Discharged Condition: good  Hospital Course: Pt seen and admitted for symptomatic anemia related to her menometrorrhagia.  She was treated w 2 U PRBCs, Provera, and Lysteda for control of bleeding and treatment of anemia.  She will be discharge with short term management to involve these medicines, and office visit for long term plans such as IUD for menometrorrhagia control.  Iron therapy discussed as well.   Consults: None  Significant Diagnostic Studies: labs:  Results for orders placed or performed during the hospital encounter of 07/05/19  SARS Coronavirus 2 by RT PCR (hospital order, performed in [facility] hospital lab) Nasopharyngeal Nasopharyngeal Swab   Specimen: Nasopharyngeal Swab  Result Value Ref Range   SARS Coronavirus 2 NEGATIVE NEGATIVE  Lipase, blood  Result Value Ref Range   Lipase 34 11 - 51 U/L  Comprehensive metabolic panel  Result Value Ref Range   Sodium 136 135 - 145 mmol/L   Potassium 4.0 3.5 - 5.1 mmol/L   Chloride 107 98 - 111 mmol/L   CO2 23 22 - 32 mmol/L   Glucose, Bld 106 (H) 70 - 99 mg/dL   BUN 12 6 - 20 mg/dL   Creatinine, Ser 1.24 (L) 0.44 - 1.00 mg/dL   Calcium 8.9 8.9 - 58.0 mg/dL   Total Protein 7.8 6.5 - 8.1 g/dL   Albumin 4.0 3.5 - 5.0 g/dL   AST 19 15 - 41 U/L   ALT 27 0 - 44 U/L   Alkaline Phosphatase 66 38 - 126 U/L   Total Bilirubin 0.5 0.3 - 1.2 mg/dL   GFR calc non Af Amer >60 >60 mL/min   GFR calc Af Amer >60 >60 mL/min   Anion gap 6 5 - 15  CBC  Result Value Ref Range   WBC 8.3 4.0 - 10.5 K/uL   RBC 4.82 3.87 - 5.11 MIL/uL   Hemoglobin 7.3 (L) 12.0 - 15.0 g/dL   HCT 99.8 (L) 33.8 - 25.0 %   MCV 57.9 (L) 80.0 - 100.0 fL   MCH 15.1 (L) 26.0 - 34.0 pg    MCHC 26.2 (L) 30.0 - 36.0 g/dL   RDW 53.9 (H) 76.7 - 34.1 %   Platelets 660 (H) 150 - 400 K/uL   nRBC 0.0 0.0 - 0.2 %  Urinalysis, Complete w Microscopic  Result Value Ref Range   Color, Urine YELLOW (A) YELLOW   APPearance HAZY (A) CLEAR   Specific Gravity, Urine 1.026 1.005 - 1.030   pH 5.0 5.0 - 8.0   Glucose, UA NEGATIVE NEGATIVE mg/dL   Hgb urine dipstick SMALL (A) NEGATIVE   Bilirubin Urine NEGATIVE NEGATIVE   Ketones, ur NEGATIVE NEGATIVE mg/dL   Protein, ur NEGATIVE NEGATIVE mg/dL   Nitrite NEGATIVE NEGATIVE   Leukocytes,Ua NEGATIVE NEGATIVE   RBC / HPF 6-10 0 - 5 RBC/hpf   WBC, UA 0-5 0 - 5 WBC/hpf   Bacteria, UA NONE SEEN NONE SEEN   Squamous Epithelial / LPF 0-5 0 - 5   Mucus PRESENT   CBC  Result Value Ref Range   WBC 10.4 4.0 - 10.5 K/uL   RBC 5.36 (H) 3.87 - 5.11 MIL/uL   Hemoglobin 9.8 (L) 12.0 - 15.0 g/dL   HCT 93.7 (L) 90.2 - 40.9 %  MCV 62.3 (L) 80.0 - 100.0 fL   MCH 18.3 (L) 26.0 - 34.0 pg   MCHC 29.3 (L) 30.0 - 36.0 g/dL   RDW 62.7 (H) 03.5 - 00.9 %   Platelets 567 (H) 150 - 400 K/uL   nRBC 0.2 0.0 - 0.2 %  Pregnancy, urine POC  Result Value Ref Range   Preg Test, Ur NEGATIVE NEGATIVE  Sample to Blood Bank  Result Value Ref Range   Blood Bank Specimen SAMPLE AVAILABLE FOR TESTING    Sample Expiration      07/08/2019,2359 Performed at [facility], [address] [place], [place] [address]   Type and screen [facility]  Result Value Ref Range   ABO/RH(D) A POS    Antibody Screen NEG    Sample Expiration 07/08/2019,2359    Unit Number [id number]    Blood Component Type RBC LR PHER1    Unit division 00    Status of Unit ISSUED    Transfusion Status OK TO TRANSFUSE    Crossmatch Result Compatible    Unit Number [id number]    Blood Component Type RED CELLS,LR    Unit division 00    Status of Unit ISSUED    Transfusion Status OK TO TRANSFUSE    Crossmatch Result      Compatible Performed at [facility], [address], [place], [place] [address]   Prepare RBC (crossmatch)  Result Value Ref Range   Order Confirmation      ORDER PROCESSED BY BLOOD BANK Performed at [facility], [address] [place], [place] [address]   BPAM [facility]  Result Value Ref Range   ISSUE DATE / TIME [id number]    Blood Product Unit Number [id number]    PRODUCT CODE [id number]    Unit Type and Rh 0600    Blood Product Expiration Date [id number]    ISSUE DATE / TIME [id number]    Blood Product Unit Number [id number]    PRODUCT CODE [id number]    Unit Type and Rh 6200    Blood Product Expiration Date [id number]     and [place], see results  Treatments: IV hydration and transfusion  Discharge Exam: Blood pressure (!) 101/50, pulse 80, temperature 98 F (36.7 C), temperature source Oral, resp. rate 18, height 5\' 1"  (1.549 m), weight 88 kg, SpO2 100 %. General appearance: alert, cooperative and no distress Head: Normocephalic, without obvious abnormality, atraumatic Neck: no adenopathy, no carotid bruit, no JVD, supple, symmetrical, trachea midline and thyroid not enlarged, symmetric, no tenderness/mass/nodules Resp: clear to auscultation bilaterally Cardio: regular rate and rhythm, S1, S2 normal, no murmur, click, rub or gallop GI: soft, non-tender; bowel sounds normal; no masses,  no organomegaly Pelvic: no significant blood on pad Extremities: extremities normal, atraumatic, no cyanosis or edema Pulses: 2+ and symmetric Skin: Skin color, texture, turgor normal. No rashes or lesions Neurologic: Grossly normal  Disposition: Discharge disposition: 01-Home or Self Care       Discharge Instructions    Call MD for:   Complete by: As directed    Excessive bleeding   Call MD for:  persistant dizziness or light-headedness   Complete by: As directed      Allergies as of 07/06/2019      Reactions   Other Itching   Pistachios = severe swelling, walnuts, bananas, apples =  itching      Medication List    TAKE these medications   cetirizine 10 MG tablet Commonly known  as: ZYRTEC Take 10 mg by mouth daily.   ferrous sulfate 325 (65 FE) MG tablet Take 325 mg by mouth daily with breakfast.   medroxyPROGESTERone 10 MG tablet Commonly known as: PROVERA Take 2 tablets (20 mg total) by mouth daily.   multivitamin capsule Take 1 capsule by mouth daily.   tranexamic acid 650 MG Tabs tablet Commonly known as: LYSTEDA Take 2 tablets (1,300 mg total) by mouth 3 (three) times daily for 3 days.        Signed: Hoyt Koch 07/06/2019, 9:34 AM

## 2019-07-08 NOTE — Telephone Encounter (Signed)
Still pending review for amount for device

## 2019-07-15 ENCOUNTER — Encounter: Payer: Self-pay | Admitting: Obstetrics & Gynecology

## 2019-07-15 ENCOUNTER — Ambulatory Visit (INDEPENDENT_AMBULATORY_CARE_PROVIDER_SITE_OTHER): Payer: Self-pay | Admitting: Obstetrics & Gynecology

## 2019-07-15 ENCOUNTER — Ambulatory Visit (INDEPENDENT_AMBULATORY_CARE_PROVIDER_SITE_OTHER): Payer: Self-pay

## 2019-07-15 ENCOUNTER — Other Ambulatory Visit: Payer: Self-pay

## 2019-07-15 VITALS — BP 120/80 | Ht 61.0 in | Wt 200.0 lb

## 2019-07-15 DIAGNOSIS — N939 Abnormal uterine and vaginal bleeding, unspecified: Secondary | ICD-10-CM

## 2019-07-15 DIAGNOSIS — D649 Anemia, unspecified: Secondary | ICD-10-CM

## 2019-07-15 MED ORDER — MEDROXYPROGESTERONE ACETATE 150 MG/ML IM SUSP
150.0000 mg | Freq: Once | INTRAMUSCULAR | Status: AC
  Administered 2019-07-15: 150 mg via INTRAMUSCULAR

## 2019-07-15 MED ORDER — MEDROXYPROGESTERONE ACETATE 150 MG/ML IM SUSP
150.0000 mg | INTRAMUSCULAR | 1 refills | Status: DC
Start: 2019-07-15 — End: 2020-02-23

## 2019-07-15 NOTE — Telephone Encounter (Signed)
Per [facility] will counsel patient at appointment to go over her options due to self pay status

## 2019-07-15 NOTE — Patient Instructions (Signed)
Medroxyprogesterone injection [Contraceptive] What is this medicine? MEDROXYPROGESTERONE (me DROX ee proe JES te rone) contraceptive injections prevent pregnancy. They provide effective birth control for 3 months. Depo-subQ Provera 104 is also used for treating pain related to endometriosis. This medicine may be used for other purposes; ask your health care provider or pharmacist if you have questions. COMMON BRAND NAME(S): Depo-Provera, Depo-subQ Provera 104 What should I tell my health care provider before I take this medicine? They need to know if you have any of these conditions:  frequently drink alcohol  asthma  blood vessel disease or a history of a blood clot in the lungs or legs  bone disease such as osteoporosis  breast cancer  diabetes  eating disorder (anorexia nervosa or bulimia)  high blood pressure  HIV infection or AIDS  kidney disease  liver disease  mental depression  migraine  seizures (convulsions)  stroke  tobacco smoker  vaginal bleeding  an unusual or allergic reaction to medroxyprogesterone, other hormones, medicines, foods, dyes, or preservatives  pregnant or trying to get pregnant  breast-feeding How should I use this medicine? Depo-Provera Contraceptive injection is given into a muscle. Depo-subQ Provera 104 injection is given under the skin. These injections are given by a health care professional. You must not be pregnant before getting an injection. The injection is usually given during the first 5 days after the start of a menstrual period or 6 weeks after delivery of a baby. Talk to your pediatrician regarding the use of this medicine in children. Special care may be needed. These injections have been used in female children who have started having menstrual periods. Overdosage: If you think you have taken too much of this medicine contact a poison control center or emergency room at once. NOTE: This medicine is only for you. Do not  share this medicine with others. What if I miss a dose? Try not to miss a dose. You must get an injection once every 3 months to maintain birth control. If you cannot keep an appointment, call and reschedule it. If you wait longer than 13 weeks between Depo-Provera contraceptive injections or longer than 14 weeks between Depo-subQ Provera 104 injections, you could get pregnant. Use another method for birth control if you miss your appointment. You may also need a pregnancy test before receiving another injection. What may interact with this medicine? Do not take this medicine with any of the following medications:  bosentan This medicine may also interact with the following medications:  aminoglutethimide  antibiotics or medicines for infections, especially rifampin, rifabutin, rifapentine, and griseofulvin  aprepitant  barbiturate medicines such as phenobarbital or primidone  bexarotene  carbamazepine  medicines for seizures like ethotoin, felbamate, oxcarbazepine, phenytoin, topiramate  modafinil  St. John's wort This list may not describe all possible interactions. Give your health care provider a list of all the medicines, herbs, non-prescription drugs, or dietary supplements you use. Also tell them if you smoke, drink alcohol, or use illegal drugs. Some items may interact with your medicine. What should I watch for while using this medicine? This drug does not protect you against HIV infection (AIDS) or other sexually transmitted diseases. Use of this product may cause you to lose calcium from your bones. Loss of calcium may cause weak bones (osteoporosis). Only use this product for more than 2 years if other forms of birth control are not right for you. The longer you use this product for birth control the more likely you will be at risk   for weak bones. Ask your health care professional how you can keep strong bones. You may have a change in bleeding pattern or irregular periods.  Many females stop having periods while taking this drug. If you have received your injections on time, your chance of being pregnant is very low. If you think you may be pregnant, see your health care professional as soon as possible. Tell your health care professional if you want to get pregnant within the next year. The effect of this medicine may last a long time after you get your last injection. What side effects may I notice from receiving this medicine? Side effects that you should report to your doctor or health care professional as soon as possible:  allergic reactions like skin rash, itching or hives, swelling of the face, lips, or tongue  breast tenderness or discharge  breathing problems  changes in vision  depression  feeling faint or lightheaded, falls  fever  pain in the abdomen, chest, groin, or leg  problems with balance, talking, walking  unusually weak or tired  yellowing of the eyes or skin Side effects that usually do not require medical attention (report to your doctor or health care professional if they continue or are bothersome):  acne  fluid retention and swelling  headache  irregular periods, spotting, or absent periods  temporary pain, itching, or skin reaction at site where injected  weight gain This list may not describe all possible side effects. Call your doctor for medical advice about side effects. You may report side effects to FDA at 1-800-FDA-1088. Where should I keep my medicine? This does not apply. The injection will be given to you by a health care professional. NOTE: This sheet is a summary. It may not cover all possible information. If you have questions about this medicine, talk to your doctor, pharmacist, or health care provider.  2020 Elsevier/Gold Standard (2008-02-19 18:37:56)  

## 2019-07-15 NOTE — Progress Notes (Signed)
Patient presents today for Depo Provera injection without a UPT since she is currently bleeding. Given IM Right Deltoid. Patient tolerated well. Per [facility], no charge.

## 2019-07-15 NOTE — Progress Notes (Signed)
  HPI:      [name] is a 29 y.o. G0P0000 who LMP was No LMP recorded. (Menstrual status: Irregular Periods)., presents today for a problem visit.  She complains of menometrorrhagia that  began several weeks ago and its severity is described as severe.  She has had blood transfusion and Provera and Lysteda therapy to stop the bleeding.  After finishing the meds, it has returned.  Provera did well in stopping the period. They are associated with moderate menstrual cramping.  She has used the following for attempts at control: tampon and pad.  Previous evaluation: emergency room visit on 5 2021. Prior Diagnosis: dysfunctional uterine bleeding. Previous Treatment: Provera, Lysteda, Transfusiuon   Prior OCP no help.  She is has sex with males.  Hx of STDs: none. She is premenopausal.  PMHx: She  has a past medical history of Anemia and Ovarian cyst. Also,  has a past surgical history that includes Cholecystectomy (01/2012)., family history includes Diabetes in her maternal grandfather.,  reports that she has never smoked. She has never used smokeless tobacco. She reports that she does not drink alcohol or use drugs.  She  Current Outpatient Medications:  .  cetirizine (ZYRTEC) 10 MG tablet, Take 10 mg by mouth daily., Disp: , Rfl:  .  ferrous sulfate 325 (65 FE) MG tablet, Take 325 mg by mouth daily with breakfast., Disp: , Rfl:  .  medroxyPROGESTERone (DEPO-PROVERA) 150 MG/ML injection, Inject 1 mL (150 mg total) into the muscle every 3 (three) months., Disp: 1 mL, Rfl: 1 .  medroxyPROGESTERone (PROVERA) 10 MG tablet, Take 2 tablets (20 mg total) by mouth daily. (Patient not taking: Reported on 07/15/2019), Disp: 9 tablet, Rfl: 1 .  Multiple Vitamin (MULTIVITAMIN) capsule, Take 1 capsule by mouth daily., Disp: , Rfl:   Also, is allergic to other.  Review of Systems  All other systems reviewed and are negative.   Objective: BP 120/80   Ht 5\' 1"  (1.549 m)   Wt 200 lb (90.7 kg)   BMI  37.79 kg/m  Physical Exam Constitutional:      General: She is not in acute distress.    Appearance: She is well-developed.  Musculoskeletal:        General: Normal range of motion.  Neurological:     Mental Status: She is alert and oriented to person, place, and time.  Skin:    General: Skin is warm and dry.  Vitals reviewed.     ASSESSMENT/PLAN:  menometrorrhagia  Problem List Items Addressed This Visit      Other   Symptomatic anemia    Other Visit Diagnoses    Vaginal bleeding    -  Primary    Depo vs Mirena discussed, prefers IUD but waiting for [facility] or place to receive it for min cost.  Will provide Depo now to help until timing right for IUD.  Cont Fe for anemia.  Needs PAP.  Wishes to defer until has ins.     , MD, Annamarie Major Ob/Gyn, [facility] 07/15/2019  2:32 PM

## 2019-09-13 ENCOUNTER — Encounter: Payer: Self-pay | Admitting: Emergency Medicine

## 2019-09-13 ENCOUNTER — Other Ambulatory Visit: Payer: Self-pay

## 2019-09-13 ENCOUNTER — Emergency Department
Admission: EM | Admit: 2019-09-13 | Discharge: 2019-09-13 | Disposition: A | Discharge status: Home / Self Care | Payer: Self-pay | Attending: Emergency Medicine | Admitting: Emergency Medicine

## 2019-09-13 ENCOUNTER — Emergency Department: Payer: Self-pay

## 2019-09-13 DIAGNOSIS — N939 Abnormal uterine and vaginal bleeding, unspecified: Secondary | ICD-10-CM | POA: Insufficient documentation

## 2019-09-13 DIAGNOSIS — R102 Pelvic and perineal pain: Secondary | ICD-10-CM

## 2019-09-13 DIAGNOSIS — D649 Anemia, unspecified: Secondary | ICD-10-CM | POA: Insufficient documentation

## 2019-09-13 DIAGNOSIS — K5900 Constipation, unspecified: Secondary | ICD-10-CM | POA: Insufficient documentation

## 2019-09-13 DIAGNOSIS — R110 Nausea: Secondary | ICD-10-CM | POA: Insufficient documentation

## 2019-09-13 DIAGNOSIS — E876 Hypokalemia: Secondary | ICD-10-CM | POA: Insufficient documentation

## 2019-09-13 LAB — LIPASE, BLOOD: Lipase: 38 U/L (ref 11–51)

## 2019-09-13 LAB — URINALYSIS, COMPLETE (UACMP) WITH MICROSCOPIC
Bilirubin Urine: NEGATIVE
Glucose, UA: NEGATIVE mg/dL
Ketones, ur: 5 mg/dL — AB
Leukocytes,Ua: NEGATIVE
Nitrite: NEGATIVE
Protein, ur: 30 mg/dL — AB
Specific Gravity, Urine: 1.01 (ref 1.005–1.030)
pH: 6 (ref 5.0–8.0)

## 2019-09-13 LAB — WET PREP, GENITAL
Clue Cells Wet Prep HPF POC: NONE SEEN
Sperm: NONE SEEN
Trich, Wet Prep: NONE SEEN
Yeast Wet Prep HPF POC: NONE SEEN

## 2019-09-13 LAB — CBC
HCT: 35.8 % — ABNORMAL LOW (ref 36.0–46.0)
Hemoglobin: 10.9 g/dL — ABNORMAL LOW (ref 12.0–15.0)
MCH: 20.4 pg — ABNORMAL LOW (ref 26.0–34.0)
MCHC: 30.4 g/dL (ref 30.0–36.0)
MCV: 67 fL — ABNORMAL LOW (ref 80.0–100.0)
Platelets: 411 10*3/uL — ABNORMAL HIGH (ref 150–400)
RBC: 5.34 MIL/uL — ABNORMAL HIGH (ref 3.87–5.11)
RDW: 23 % — ABNORMAL HIGH (ref 11.5–15.5)
WBC: 10.8 10*3/uL — ABNORMAL HIGH (ref 4.0–10.5)
nRBC: 0 % (ref 0.0–0.2)

## 2019-09-13 LAB — COMPREHENSIVE METABOLIC PANEL
ALT: 21 U/L (ref 0–44)
AST: 14 U/L — ABNORMAL LOW (ref 15–41)
Albumin: 4.1 g/dL (ref 3.5–5.0)
Alkaline Phosphatase: 68 U/L (ref 38–126)
Anion gap: 11 (ref 5–15)
BUN: 11 mg/dL (ref 6–20)
CO2: 23 mmol/L (ref 22–32)
Calcium: 8.9 mg/dL (ref 8.9–10.3)
Chloride: 105 mmol/L (ref 98–111)
Creatinine, Ser: 0.54 mg/dL (ref 0.44–1.00)
GFR calc Af Amer: >60 mL/min (ref >60)
GFR calc non Af Amer: >60 mL/min (ref >60)
Glucose, Bld: 145 mg/dL — ABNORMAL HIGH (ref 70–99)
Potassium: 3.3 mmol/L — ABNORMAL LOW (ref 3.5–5.1)
Sodium: 139 mmol/L (ref 135–145)
Total Bilirubin: 0.4 mg/dL (ref 0.3–1.2)
Total Protein: 7.7 g/dL (ref 6.5–8.1)

## 2019-09-13 LAB — ABO/RH: ABO/RH(D): A POS

## 2019-09-13 LAB — CHLAMYDIA/NGC RT PCR (ARMC ONLY)
Chlamydia Tr: NOT DETECTED
N gonorrhoeae: NOT DETECTED

## 2019-09-13 LAB — HCG, QUANTITATIVE, PREGNANCY: hCG, Beta Chain, Quant, S: <1 m[IU]/mL (ref <5)

## 2019-09-13 MED ORDER — POTASSIUM CHLORIDE CRYS ER 20 MEQ PO TBCR
40.0000 meq | EXTENDED_RELEASE_TABLET | Freq: Once | ORAL | Status: AC
  Administered 2019-09-13: 40 meq via ORAL
  Filled 2019-09-13: qty 2

## 2019-09-13 MED ORDER — IOHEXOL 300 MG/ML  SOLN
100.0000 mL | Freq: Once | INTRAMUSCULAR | Status: AC | PRN
  Administered 2019-09-13: 100 mL via INTRAVENOUS

## 2019-09-13 NOTE — ED Triage Notes (Addendum)
Patient states that she has lower abdominal pain times three days. Patient states that she is also having constipation. Patient states that she took a home pregnancy test last week and it was positive. Patient states that since she has taken a home pregnancy test and it was negative. Patient states that she started having vaginal bleeding.  Patient states that she stopped to use the bathroom here and she "passed something vaginally that looked like a piece of meat"

## 2019-09-13 NOTE — ED Notes (Signed)
Patient given update on wait time. Patient verbalizes understanding.  

## 2019-09-13 NOTE — ED Provider Notes (Signed)
[facility] Emergency Department Provider Note  ____________________________________________   First MD Initiated Contact with Patient 09/13/19 0818     (approximate)  I have reviewed the triage vital signs and the nursing notes.   HISTORY  Chief Complaint Abdominal Pain   HPI Tina Gutierrez is a 29 y.o. female with a past medical history of menometrorrhagia currently on Provera who presents for assessment of 3 days of crampy lower pelvic pain that got acutely worse last night was associated with vaginal spotting over the last several days as well as passage of a large clot and possible fetal tissue on the way to the ED early this morning.  Patient also states she has had constipation last several days as well as some lower back pain and intermittent heartburn.  No prior similar episodes.  No clear alleviating or aggravating factors or Tylenol did not help.  She denies any fevers, cough, headache, earache, sore throat, extremity pain, rash, burning with urination, or other abnormal vaginal discharge.  She states she is never been pregnant before about a positive pregnancy test last week.  No other acute complaints at this time.         Past Medical History:  Diagnosis Date  . Anemia   . Ovarian cyst     Patient Active Problem List   Diagnosis Date Noted  . Menometrorrhagia 07/05/2019  . Symptomatic anemia   . Abnormal uterine bleeding 06/27/2017    Past Surgical History:  Procedure Laterality Date  . CHOLECYSTECTOMY  01/2012   [facility]    Prior to Admission medications   Medication Sig Start Date End Date Taking? Authorizing Provider  cetirizine (ZYRTEC) 10 MG tablet Take 10 mg by mouth daily.    [provider]  ferrous sulfate 325 (65 FE) MG tablet Take 325 mg by mouth daily with breakfast.    [provider]  medroxyPROGESTERone (DEPO-PROVERA) 150 MG/ML injection Inject 1 mL (150 mg total) into the muscle every 3 (three) months.  07/15/19   Nadara Mustard, MD  medroxyPROGESTERone (PROVERA) 10 MG tablet Take 2 tablets (20 mg total) by mouth daily. Patient not taking: Reported on 07/15/2019 07/06/19   Nadara Mustard, MD  Multiple Vitamin (MULTIVITAMIN) capsule Take 1 capsule by mouth daily.    [provider]    Allergies Other  Family History  Problem Relation Age of Onset  . Diabetes Maternal Grandfather        TYPE 2    Social History Social History   Tobacco Use  . Smoking status: Never Smoker  . Smokeless tobacco: Never Used  Vaping Use  . Vaping Use: Never used  Substance Use Topics  . Alcohol use: No  . Drug use: Never    Review of Systems  Review of Systems  Constitutional: Negative for chills and fever.  HENT: Negative for sore throat.   Eyes: Negative for pain.  Respiratory: Negative for cough and stridor.   Cardiovascular: Negative for chest pain.  Gastrointestinal: Positive for abdominal pain, constipation and nausea. Negative for vomiting.  Genitourinary:       Vaginal spotting, passage of clots  Skin: Negative for rash.  Neurological: Negative for seizures, loss of consciousness and headaches.  Psychiatric/Behavioral: Negative for suicidal ideas.  All other systems reviewed and are negative.     ____________________________________________   PHYSICAL EXAM:  VITAL SIGNS: ED Triage Vitals  Enc Vitals Group     BP 09/13/19 0400 127/75     Pulse  Rate 09/13/19 0400 89     Resp 09/13/19 0400 18     Temp 09/13/19 0400 98.3 F (36.8 C)     Temp Source 09/13/19 0400 Oral     SpO2 09/13/19 0400 100 %     Weight 09/13/19 0402 200 lb (90.7 kg)     Height 09/13/19 0402 5\' 2"  (1.575 m)     Head Circumference --      Peak Flow --      Pain Score 09/13/19 0401 7     Pain Loc --      Pain Edu? --      Excl. in GC? --    Vitals:   09/13/19 0400  BP: 127/75  Pulse: 89  Resp: 18  Temp: 98.3 F (36.8 C)  SpO2: 100%   Physical Exam Vitals and nursing note reviewed.  Exam conducted with a chaperone present.  Constitutional:      General: She is not in acute distress.    Appearance: She is well-developed.  HENT:     Head: Normocephalic and atraumatic.     Right Ear: External ear normal.     Left Ear: External ear normal.     Nose: Nose normal.     Mouth/Throat:     Mouth: Mucous membranes are moist.  Eyes:     Conjunctiva/sclera: Conjunctivae normal.  Cardiovascular:     Rate and Rhythm: Normal rate and regular rhythm.     Heart sounds: No murmur heard.   Pulmonary:     Effort: Pulmonary effort is normal. No respiratory distress.     Breath sounds: Normal breath sounds.  Abdominal:     Palpations: Abdomen is soft.     Tenderness: There is no abdominal tenderness. There is no right CVA tenderness or left CVA tenderness.  Genitourinary:    Exam position: Supine.     Cervix: Discharge ( scant blood from closed external os) present. No cervical motion tenderness, friability or lesion.  Musculoskeletal:     Cervical back: Neck supple.  Skin:    General: Skin is warm and dry.  Neurological:     Mental Status: She is alert and oriented to person, place, and time.  Psychiatric:        Mood and Affect: Mood normal.      ____________________________________________   LABS (all labs ordered are listed, but only abnormal results are displayed)  Labs Reviewed  WET PREP, GENITAL - Abnormal; Notable for the following components:      Result Value   WBC, Wet Prep HPF POC MANY (*)    All other components within normal limits  COMPREHENSIVE METABOLIC PANEL - Abnormal; Notable for the following components:   Potassium 3.3 (*)    Glucose, Bld 145 (*)    AST 14 (*)    All other components within normal limits  CBC - Abnormal; Notable for the following components:   WBC 10.8 (*)    RBC 5.34 (*)    Hemoglobin 10.9 (*)    HCT 35.8 (*)    MCV 67.0 (*)    MCH 20.4 (*)    RDW 23.0 (*)    Platelets 411 (*)    All other components within normal  limits  URINALYSIS, COMPLETE (UACMP) WITH MICROSCOPIC - Abnormal; Notable for the following components:   Color, Urine YELLOW (*)    APPearance HAZY (*)    Hgb urine dipstick LARGE (*)    Ketones, ur 5 (*)    Protein, ur  30 (*)    Bacteria, UA RARE (*)    All other components within normal limits  CHLAMYDIA/NGC RT PCR ([facility] ONLY)  LIPASE, BLOOD  HCG, QUANTITATIVE, PREGNANCY  POC URINE PREG, ED  ABO/RH   ____________________________________________  ____________________________________________  RADIOLOGY   Official radiology report(s): CT ABDOMEN PELVIS W CONTRAST  Result Date: 09/13/2019 CLINICAL DATA:  29 year old female with abdominal pain, pelvic pain for 3 days. Nausea. Vaginal bleeding. Negative pregnancy test. Leukocytosis. EXAM: CT ABDOMEN AND PELVIS WITH CONTRAST TECHNIQUE: Multidetector CT imaging of the abdomen and pelvis was performed using the standard protocol following bolus administration of intravenous contrast. CONTRAST:  100mL OMNIPAQUE IOHEXOL 300 MG/ML  SOLN COMPARISON:  Pelvis ultrasound 06/15/2019. CT Abdomen and Pelvis 05/04/2014. FINDINGS: Lower chest: Negative. Hepatobiliary: Chronically absent gallbladder. Mild hepatic steatosis suspected but otherwise negative liver. No bile duct enlargement. Pancreas: Negative. Spleen: Negative. Adrenals/Urinary Tract: Normal adrenal glands. Symmetric and normal renal contrast enhancement. No nephrolithiasis is evident. Both ureters appear normal to the bladder. There is a tiny chronic and benign appearing right renal midpole cortical cyst or angiomyolipoma. Diminutive, unremarkable urinary bladder. Stomach/Bowel: Negative large bowel with normal appendix (series 2, image 61). Negative terminal ileum. No dilated small bowel. Trace oral contrast in the stomach and duodenum. No free air, free fluid, mesenteric stranding. Vascular/Lymphatic: Major arterial structures are patent with no atherosclerosis. Portal venous system  is patent. No lymphadenopathy. Reproductive: Stable from the ultrasound in May, similar CT appearance to that in 2016. Other: No pelvic free fluid. Musculoskeletal: Negative. IMPRESSION: 1. No acute or inflammatory process identified in the abdomen. Pelvis appears stable from the May ultrasound (please see that report). 2. Mild hepatic steatosis suspected. Electronically Signed   By: Odessa FlemingH  Hall M.D.   On: 09/13/2019 09:59    ____________________________________________   PROCEDURES  Procedure(s) performed (including Critical Care):  Procedures   ____________________________________________   INITIAL IMPRESSION / ASSESSMENT AND PLAN / ED COURSE        Overall patient's history, exam, a work-up is consistent with possible complete miscarriage given her os is closed she has a negative pregnancy test reporting passage of large clots with possible fetal tissue.  However it is somewhat unexpected for hCG to be undetectable if this was in the last day.  Presentation and work-up is not consistent with PID, TOA, appendicitis, diverticulitis, kidney stone, vaginitis, or ectopic pregnancy.  Explained to the patient that her hemoglobin was low today and I was concerned she may have had a miscarriage was unable to identify other cause for her pain and vaginal spotting.  Patient declined urology stating she would take ibuprofen when she got home.  She states she was already aware she was anemic and would follow-up with her primary care doctor.  Patient discharged stable condition.  Strict return precautions advised and discussed.  Medications  potassium chloride SA (KLOR-CON) CR tablet 40 mEq (40 mEq Oral Given 09/13/19 0921)  iohexol (OMNIPAQUE) 300 MG/ML solution 100 mL (100 mLs Intravenous Contrast Given 09/13/19 0941)         Clinical Course as of Sep 12 1008  Mon Sep 13, 2019  0921 HCG, Clement SayresBeta Chain, Quant, S: <1 [ZS]    Clinical Course User Index [ZS] Gilles ChiquitoSmith, Louvenia Golomb P, MD      ____________________________________________   FINAL CLINICAL IMPRESSION(S) / ED DIAGNOSES  Final diagnoses:  Low hemoglobin  Hypokalemia  Vaginal bleeding  Pelvic pain     ED Discharge Orders    None  Note:  This document was prepared using Dragon voice recognition software and may include unintentional dictation errors.   Gilles Chiquito, MD 09/13/19 1013

## 2019-09-15 ENCOUNTER — Telehealth: Payer: Self-pay

## 2019-09-15 NOTE — Telephone Encounter (Signed)
Pt calling; was seen in ED Sunday for heavy bleeding; neg preg test; was told could be a poss miscarriage; was told to f/u c [place]; front desk told her they couldn't get her in until September; the pain is really bad like ctxs; advil, tylenol 500mg  not helping c the pain.  Can something be called in?  [phone]

## 2019-09-15 NOTE — Telephone Encounter (Signed)
Pt states she has a depo inj scheduled on the 28th; has been on depo for awhile and this is the first time she has bled; is this a side effect of the depo or is something else going on?  Adv will make PH aware of this and ask his opinion of what could be the cause of this.

## 2019-09-15 NOTE — Telephone Encounter (Signed)
Not pregnant or miscarriage based on labs thru ER visit.  We in past recommended hormonal therapy, still do.  Would she want injection (Depo)? Or pill, IUD.  Cost a concern for her due to no ins.  Let me know what she wants.  No pain meds.  Can take Motrin up to 800 mg at a time 3 times daily.

## 2019-09-16 NOTE — Telephone Encounter (Signed)
LMTC

## 2019-09-17 NOTE — Telephone Encounter (Signed)
Pt aware; rx'd to [facility] for rescheduling per PH.

## 2019-09-30 ENCOUNTER — Other Ambulatory Visit: Payer: Self-pay

## 2019-09-30 ENCOUNTER — Ambulatory Visit (INDEPENDENT_AMBULATORY_CARE_PROVIDER_SITE_OTHER): Payer: Self-pay

## 2019-09-30 DIAGNOSIS — N939 Abnormal uterine and vaginal bleeding, unspecified: Secondary | ICD-10-CM

## 2019-09-30 MED ORDER — MEDROXYPROGESTERONE ACETATE 150 MG/ML IM SUSP
150.0000 mg | Freq: Once | INTRAMUSCULAR | Status: AC
  Administered 2019-09-30: 150 mg via INTRAMUSCULAR

## 2019-10-07 ENCOUNTER — Ambulatory Visit: Payer: Self-pay

## 2019-12-24 ENCOUNTER — Other Ambulatory Visit (HOSPITAL_COMMUNITY)
Admission: RE | Admit: 2019-12-24 | Discharge: 2019-12-24 | Disposition: A | Discharge status: Home / Self Care | Payer: Medicaid Other | Source: Ambulatory Visit | Attending: Obstetrics & Gynecology | Admitting: Obstetrics & Gynecology

## 2019-12-24 ENCOUNTER — Other Ambulatory Visit: Payer: Self-pay

## 2019-12-24 ENCOUNTER — Ambulatory Visit (INDEPENDENT_AMBULATORY_CARE_PROVIDER_SITE_OTHER): Payer: Medicaid Other | Admitting: Obstetrics & Gynecology

## 2019-12-24 ENCOUNTER — Encounter: Payer: Self-pay | Admitting: Obstetrics & Gynecology

## 2019-12-24 VITALS — BP 120/80 | Ht 62.0 in | Wt 211.0 lb

## 2019-12-24 DIAGNOSIS — Z124 Encounter for screening for malignant neoplasm of cervix: Secondary | ICD-10-CM

## 2019-12-24 DIAGNOSIS — Z6838 Body mass index (BMI) 38.0-38.9, adult: Secondary | ICD-10-CM | POA: Diagnosis not present

## 2019-12-24 DIAGNOSIS — Z01419 Encounter for gynecological examination (general) (routine) without abnormal findings: Secondary | ICD-10-CM | POA: Diagnosis not present

## 2019-12-24 DIAGNOSIS — E6609 Other obesity due to excess calories: Secondary | ICD-10-CM | POA: Diagnosis not present

## 2019-12-24 MED ORDER — NORETHIN ACE-ETH ESTRAD-FE 1-20 MG-MCG PO TABS
1.0000 | ORAL_TABLET | Freq: Every day | ORAL | 11 refills | Status: DC
Start: 2019-12-24 — End: 2020-02-22

## 2019-12-24 NOTE — Progress Notes (Signed)
HPI:      [name] is a 29 y.o. G0P0000 who LMP was No LMP recorded. (Menstrual status: Irregular Periods)., she presents today for her annual examination. The patient has no complaints today.  She has a h/o irreg and heavy periods, leading to need for meds or blood transfusion in past, successfully treated w Depo at this time, w no periods and scant spotting towards the end of the 12 weeks each Depo cycle.  She now desires consideration for weight loss then pregnancy.  The patient is sexually active. Her last pap: approximate date 2019 and was normal. The patient does perform self breast exams.  There is no notable family history of breast or ovarian cancer in her family.  The patient has regular exercise: yes.  The patient denies current symptoms of depression.    GYN History: Contraception: Depo-Provera injections  PMHx: Past Medical History:  Diagnosis Date   Anemia    Ovarian cyst    Past Surgical History:  Procedure Laterality Date   CHOLECYSTECTOMY  01/2012   [facility]   Family History  Problem Relation Age of Onset   Diabetes Maternal Grandfather        TYPE 2   Social History   Tobacco Use   Smoking status: Never Smoker   Smokeless tobacco: Never Used  Vaping Use   Vaping Use: Never used  Substance Use Topics   Alcohol use: No   Drug use: Never    Current Outpatient Medications:    norethindrone-ethinyl estradiol (JUNEL FE 1/20) 1-20 MG-MCG tablet, Take 1 tablet by mouth daily., Disp: 28 tablet, Rfl: 11 Allergies: Other  Review of Systems  Constitutional: Positive for weight loss. Negative for chills, fever and malaise/fatigue.  HENT: Negative for congestion, sinus pain and sore throat.   Eyes: Negative for blurred vision and pain.  Respiratory: Negative for cough and wheezing.   Cardiovascular: Negative for chest pain and leg swelling.  Gastrointestinal: Positive for constipation and nausea. Negative for abdominal pain, diarrhea, heartburn and  vomiting.  Genitourinary: Negative for dysuria, frequency, hematuria and urgency.  Musculoskeletal: Negative for back pain, joint pain, myalgias and neck pain.  Skin: Negative for itching and rash.  Neurological: Negative for dizziness, tremors and weakness.  Endo/Heme/Allergies: Bruises/bleeds easily.  Psychiatric/Behavioral: Positive for depression. The patient is nervous/anxious. The patient does not have insomnia.     Objective: BP 120/80    Ht 5\' 2"  (1.575 m)    Wt 211 lb (95.7 kg)    BMI 38.59 kg/m   Filed Weights   12/24/19 1522  Weight: 211 lb (95.7 kg)   Body mass index is 38.59 kg/m. Physical Exam Constitutional:      General: She is not in acute distress.    Appearance: She is well-developed.  Genitourinary:     Pelvic exam was performed with patient supine.     Vagina, uterus and rectum normal.     No lesions in the vagina.     No vaginal bleeding.     No cervical motion tenderness, friability, lesion or polyp.     Uterus is mobile.     Uterus is not enlarged.     No uterine mass detected.    Uterus is midaxial.     No right or left adnexal mass present.     Right adnexa not tender.     Left adnexa not tender.  HENT:     Head: Normocephalic and atraumatic. No laceration.  Right Ear: Hearing normal.     Left Ear: Hearing normal.     Mouth/Throat:     Pharynx: Uvula midline.  Eyes:     Pupils: Pupils are equal, round, and reactive to light.  Neck:     Thyroid: No thyromegaly.  Cardiovascular:     Rate and Rhythm: Normal rate and regular rhythm.     Heart sounds: No murmur heard.  No friction rub. No gallop.   Pulmonary:     Effort: Pulmonary effort is normal. No respiratory distress.     Breath sounds: Normal breath sounds. No wheezing.  Chest:     Breasts:        Right: No mass, skin change or tenderness.        Left: No mass, skin change or tenderness.  Abdominal:     General: Bowel sounds are normal. There is no distension.     Palpations:  Abdomen is soft.     Tenderness: There is no abdominal tenderness. There is no rebound.  Musculoskeletal:        General: Normal range of motion.     Cervical back: Normal range of motion and neck supple.  Neurological:     Mental Status: She is alert and oriented to person, place, and time.     Cranial Nerves: No cranial nerve deficit.  Skin:    General: Skin is warm and dry.  Psychiatric:        Judgment: Judgment normal.  Vitals reviewed.     Assessment:  ANNUAL EXAM 1. Women's annual routine gynecological examination   2. Screening for cervical cancer   3. Class 2 obesity due to excess calories without serious comorbidity with body mass index (BMI) of 38.0 to 38.9 in adult      Screening Plan:            1.  Cervical Screening-  Pap smear done today  2. Breast screening- Exam annually and mammogram>40 planned   3. Pregnancy planning. Advised weight loss first. Will monitor cycles off of hormones when that timing occurs   4. Labs managed by PCP  5. Counseling for contraception: Depo-Provera  Will change to OCP for now while trying to lose weight    3. Class 2 obesity due to excess calories without serious comorbidity with body mass index (BMI) of 38.0 to 38.9 in adult Diet and exercise addressed today F/u 2 mos Plan to see weight loss then and continuing on Meds to be considered if needed     F/U  Return in about 2 months (around 02/23/2020) for Follow up.  Annamarie Major, MD, [facility], [facility] 12/24/2019  3:57 PM

## 2019-12-24 NOTE — Patient Instructions (Signed)
Thank you for choosing [facility]. As part of our ongoing efforts to improve patient experience, we would appreciate your feedback. Please fill out the short survey that you will receive by mail or MyChart. Your opinion is important to [place]! -Dr Tiburcio Pea  Exercising to Lose Weight Exercise is structured, repetitive physical activity to improve fitness and health. Getting regular exercise is important for everyone. It is especially important if you are overweight. Being overweight increases your risk of heart disease, stroke, diabetes, high blood pressure, and several types of cancer. Reducing your calorie intake and exercising can help you lose weight. Exercise is usually categorized as moderate or vigorous intensity. To lose weight, most people need to do a certain amount of moderate-intensity or vigorous-intensity exercise each week. Moderate-intensity exercise  Moderate-intensity exercise is any activity that gets you moving enough to burn at least three times more energy (calories) than if you were sitting. Examples of moderate exercise include:  Walking a mile in 15 minutes.  Doing light yard work.  Biking at an easy pace. Most people should get at least 150 minutes (2 hours and 30 minutes) a week of moderate-intensity exercise to maintain their body weight. Vigorous-intensity exercise Vigorous-intensity exercise is any activity that gets you moving enough to burn at least six times more calories than if you were sitting. When you exercise at this intensity, you should be working hard enough that you are not able to carry on a conversation. Examples of vigorous exercise include:  Running.  Playing a team sport, such as football, basketball, and soccer.  Jumping rope. Most people should get at least 75 minutes (1 hour and 15 minutes) a week of vigorous-intensity exercise to maintain their body weight. How can exercise affect me? When you exercise enough to burn more calories than you  eat, you lose weight. Exercise also reduces body fat and builds muscle. The more muscle you have, the more calories you burn. Exercise also:  Improves mood.  Reduces stress and tension.  Improves your overall fitness, flexibility, and endurance.  Increases bone strength. The amount of exercise you need to lose weight depends on:  Your age.  The type of exercise.  Any health conditions you have.  Your overall physical ability. Talk to your health care provider about how much exercise you need and what types of activities are safe for you. What actions can I take to lose weight? Nutrition   Make changes to your diet as told by your health care provider or diet and nutrition specialist (dietitian). This may include: ? Eating fewer calories. ? Eating more protein. ? Eating less unhealthy fats. ? Eating a diet that includes fresh fruits and vegetables, whole grains, low-fat dairy products, and lean protein. ? Avoiding foods with added fat, salt, and sugar.  Drink plenty of water while you exercise to prevent dehydration or heat stroke. Activity  Choose an activity that you enjoy and set realistic goals. Your health care provider can help you make an exercise plan that works for you.  Exercise at a moderate or vigorous intensity most days of the week. ? The intensity of exercise may vary from person to person. You can tell how intense a workout is for you by paying attention to your breathing and heartbeat. Most people will notice their breathing and heartbeat get faster with more intense exercise.  Do resistance training twice each week, such as: ? Push-ups. ? Sit-ups. ? Lifting weights. ? Using resistance bands.  Getting short amounts of exercise  can be just as helpful as long structured periods of exercise. If you have trouble finding time to exercise, try to include exercise in your daily routine. ? Get up, stretch, and walk around every 30 minutes throughout the day. ? Go  for a walk during your lunch break. ? Park your car farther away from your destination. ? If you take public transportation, get off one stop early and walk the rest of the way. ? Make phone calls while standing up and walking around. ? Take the stairs instead of elevators or escalators.  Wear comfortable clothes and shoes with good support.  Do not exercise so much that you hurt yourself, feel dizzy, or get very short of breath. Where to find more information  U.S. Department of Health and Human Services: ThisPath.fi  Centers for Disease Control and Prevention (CDC): FootballExhibition.com.br Contact a health care provider:  Before starting a new exercise program.  If you have questions or concerns about your weight.  If you have a medical problem that keeps you from exercising. Get help right away if you have any of the following while exercising:  Injury.  Dizziness.  Difficulty breathing or shortness of breath that does not go away when you stop exercising.  Chest pain.  Rapid heartbeat. Summary  Being overweight increases your risk of heart disease, stroke, diabetes, high blood pressure, and several types of cancer.  Losing weight happens when you burn more calories than you eat.  Reducing the amount of calories you eat in addition to getting regular moderate or vigorous exercise each week helps you lose weight. This information is not intended to replace advice given to you by your health care provider. Make sure you discuss any questions you have with your health care provider. Document Revised: 02/10/2017 Document Reviewed: 02/10/2017 Elsevier Patient Education  2020 [employer].

## 2019-12-28 LAB — CYTOLOGY - PAP
Chlamydia: NEGATIVE
Comment: NEGATIVE
Comment: NEGATIVE
Comment: NORMAL
Diagnosis: NEGATIVE
Neisseria Gonorrhea: NEGATIVE
Trichomonas: NEGATIVE

## 2020-02-14 ENCOUNTER — Other Ambulatory Visit: Payer: Self-pay | Admitting: Obstetrics & Gynecology

## 2020-02-14 MED ORDER — MEDROXYPROGESTERONE ACETATE 10 MG PO TABS: 20.0000 mg | ORAL_TABLET | Freq: Every day | ORAL | 1 refills | Status: DC

## 2020-02-14 NOTE — Telephone Encounter (Addendum)
Patient requesting pharmacy to be changed to [facility]. She no longer lives in [place]. Pharmacy changed in Epic. Spoke w/patient. Advised rx has already been sent to [facility]. Advised patient to contact [facility] and ask for a transfer request of Provera from [employer].

## 2020-02-22 ENCOUNTER — Other Ambulatory Visit: Payer: Self-pay | Admitting: Obstetrics and Gynecology

## 2020-02-22 ENCOUNTER — Telehealth: Payer: Self-pay | Admitting: Obstetrics and Gynecology

## 2020-02-22 MED ORDER — NORETHIN ACE-ETH ESTRAD-FE 1-20 MG-MCG PO TABS: 1.0000 | ORAL_TABLET | Freq: Every day | ORAL | 2 refills | Status: DC

## 2020-02-22 MED ORDER — MEDROXYPROGESTERONE ACETATE 10 MG PO TABS: 20.0000 mg | ORAL_TABLET | Freq: Every day | ORAL | 0 refills | Status: DC

## 2020-02-22 NOTE — Telephone Encounter (Signed)
Pt pharmacy changed to [facility]. Rxs had been at [facility] in [place]. Pt called last wk re: Rx RF to new pharmacy and was told to just ask [facility] to transfer Rx. Pt states she has been getting "run around" and would like Rx sent directly to pharm. Took Jan dose of provera but would still like the RF in case bleeding restarts. Needs Rx RF OCPs till 11/22 annual.   Rxs eRxd by me. Pt aware RF sent.

## 2020-02-23 ENCOUNTER — Other Ambulatory Visit: Payer: Medicaid Other | Admitting: Obstetrics & Gynecology

## 2020-02-23 ENCOUNTER — Ambulatory Visit (INDEPENDENT_AMBULATORY_CARE_PROVIDER_SITE_OTHER): Payer: Self-pay | Admitting: Obstetrics & Gynecology

## 2020-02-23 ENCOUNTER — Encounter: Payer: Self-pay | Admitting: Obstetrics & Gynecology

## 2020-02-23 ENCOUNTER — Other Ambulatory Visit: Payer: Self-pay

## 2020-02-23 VITALS — BP 110/70 | Ht 62.0 in | Wt 210.0 lb

## 2020-02-23 DIAGNOSIS — E6609 Other obesity due to excess calories: Secondary | ICD-10-CM

## 2020-02-23 DIAGNOSIS — Z6838 Body mass index (BMI) 38.0-38.9, adult: Secondary | ICD-10-CM

## 2020-02-23 MED ORDER — PHENTERMINE HCL 37.5 MG PO TABS: ORAL_TABLET | ORAL | 0 refills | Status: DC

## 2020-02-23 NOTE — Progress Notes (Signed)
  History of Present Illness:  Tina Gutierrez is a 30 y.o. who was started on OCPs and coming off of Depo (last shot 09/2019) 2 mos ago for her irreg bleeding and to plan for future fertility by getting her periods right and also losing weight.  She has had one irreg period this month that has resolved.  She has lost one lb in 8 weeks thru diet and exercise attempts.  PMHx: She  has a past medical history of Anemia and Ovarian cyst. Also,  has a past surgical history that includes Cholecystectomy (01/2012)., family history includes Diabetes in her maternal grandfather.,  reports that she has never smoked. She has never used smokeless tobacco. She reports that she does not drink alcohol and does not use drugs.  She has a current medication list which includes the following prescription(s): norethindrone-ethinyl estradiol, phentermine, medroxyprogesterone, and [DISCONTINUED] ferrous sulfate. Also, is allergic to other.  Review of Systems  All other systems reviewed and are negative.  Physical Exam:  BP 110/70   Ht 5\' 2"  (1.575 m)   Wt 210 lb (95.3 kg)   LMP 02/08/2020 (Approximate)   BMI 38.41 kg/m  Body mass index is 38.41 kg/m. Filed Weights   02/23/20 1329  Weight: 210 lb (95.3 kg)   Physical Exam Constitutional:      General: She is not in acute distress.    Appearance: She is well-developed and well-nourished.  Musculoskeletal:        General: Normal range of motion.  Neurological:     Mental Status: She is alert and oriented to person, place, and time.  Skin:    General: Skin is warm and dry.  Psychiatric:        Mood and Affect: Mood and affect normal.  Vitals reviewed.    Assessment: obesity Irregular menstrual cycles  Plan: Patient is added : prescription appetite suppressants: Phentermine.   Will continue to assist patient in incorporating positive experiences into her life to promote a positive mental attitude.  Education given regarding appropriate lifestyle changes  for weight loss, including regular physical activity, healthy coping strategies, caloric restriction, and healthy eating patterns.  The risks and benefits as well as side effects of medication, such as Phenteramine or Tenuate, is discussed.  The pros and cons of suppressing appetite and boosting metabolism is counseled.  Risks of tolerance and addiction discussed.  Use of medicine will be short term.  Pt to call with any negative side effects and agrees to keep follow up appointments.  Will cont OCPs for now.  Monitor bleeding side effects.  A total of 20 minutes were spent face-to-face with the patient as well as preparation, review, communication, and documentation during this encounter.   04/22/20, MD, Annamarie Major Ob/Gyn, [facility] 02/23/2020  1:48 PM

## 2020-02-23 NOTE — Patient Instructions (Signed)
Thank you for choosing [facility]. As part of our ongoing efforts to improve patient experience, we would appreciate your feedback. Please fill out the short survey that you will receive by mail or MyChart. Your opinion is important to us! -Dr Tiago Humphrey  Phentermine tablets or capsules What is this medicine? PHENTERMINE (FEN ter meen) decreases your appetite. It is used with a reduced calorie diet and exercise to help you lose weight. This medicine may be used for other purposes; ask your health care provider or pharmacist if you have questions. COMMON BRAND NAME(S): Adipex-P, Atti-Plex P, Atti-Plex P Spansule, Fastin, Lomaira, Pro-Fast, Tara-8 What should I tell my health care provider before I take this medicine? They need to know if you have any of these conditions:  agitation or nervousness  diabetes  glaucoma  heart disease  high blood pressure  history of drug abuse or addiction  history of stroke  kidney disease  lung disease called Primary Pulmonary Hypertension (PPH)  taken an MAOI like Carbex, Eldepryl, Marplan, Nardil, or Parnate in last 14 days  taking stimulant medicines for attention disorders, weight loss, or to stay awake  thyroid disease  an unusual or allergic reaction to phentermine, other medicines, foods, dyes, or preservatives  pregnant or trying to get pregnant  breast-feeding How should I use this medicine? Take this medicine by mouth with a glass of water. Follow the directions on the prescription label. Take your medicine at regular intervals. Do not take it more often than directed. Do not stop taking except on your doctor's advice. Talk to your pediatrician regarding the use of this medicine in children. While this drug may be prescribed for children 17 years or older for selected conditions, precautions do apply. Overdosage: If you think you have taken too much of this medicine contact a poison control center or emergency room at once. NOTE:  This medicine is only for you. Do not share this medicine with others. What if I miss a dose? If you miss a dose, take it as soon as you can. If it is almost time for your next dose, take only that dose. Do not take double or extra doses. What may interact with this medicine? Do not take this medicine with any of the following medications:  MAOIs like Carbex, Eldepryl, Marplan, Nardil, and Parnate This medicine may also interact with the following medications:  alcohol  certain medicines for depression, anxiety, or psychotic disorders  certain medicines for high blood pressure  linezolid  medicines for colds or breathing difficulties like pseudoephedrine or phenylephrine  medicines for diabetes  sibutramine  stimulant medicines for attention disorders, weight loss, or to stay awake This list may not describe all possible interactions. Give your health care provider a list of all the medicines, herbs, non-prescription drugs, or dietary supplements you use. Also tell them if you smoke, drink alcohol, or use illegal drugs. Some items may interact with your medicine. What should I watch for while using this medicine? Visit your doctor or health care provider for regular checks on your progress. Do not stop taking except on your health care provider's advice. You may develop a severe reaction. Your health care provider will tell you how much medicine to take. Do not take this medicine close to bedtime. It may prevent you from sleeping. You may get drowsy or dizzy. Do not drive, use machinery, or do anything that needs mental alertness until you know how this medicine affects you. Do not stand or sit up quickly,   especially if you are an older patient. This reduces the risk of dizzy or fainting spells. Alcohol may increase dizziness and drowsiness. Avoid alcoholic drinks. This medicine may affect blood sugar levels. Ask your healthcare provider if changes in diet or medicines are needed if you  have diabetes. Women should inform their health care provider if they wish to become pregnant or think they might be pregnant. Losing weight while pregnant is not advised and may cause harm to the unborn child. Talk to your health care provider for more information. What side effects may I notice from receiving this medicine? Side effects that you should report to your doctor or health care professional as soon as possible:  allergic reactions like skin rash, itching or hives, swelling of the face, lips, or tongue  breathing problems  changes in emotions or moods  changes in vision  chest pain or chest tightness  fast, irregular heartbeat  feeling faint or lightheaded  increased blood pressure  irritable  restlessness  tremors  seizures  signs and symptoms of a stroke like changes in vision; confusion; trouble speaking or understanding; severe headaches; sudden numbness or weakness of the face, arm or leg; trouble walking; dizziness; loss of balance or coordination  unusually weak or tired Side effects that usually do not require medical attention (report to your doctor or health care professional if they continue or are bothersome):  changes in taste  constipation or diarrhea  dizziness  dry mouth  headache  trouble sleeping  upset stomach This list may not describe all possible side effects. Call your doctor for medical advice about side effects. You may report side effects to FDA at 1-800-FDA-1088. Where should I keep my medicine? Keep out of the reach of children. This medicine can be abused. Keep your medicine in a safe place to protect it from theft. Do not share this medicine with anyone. Selling or giving away this medicine is dangerous and against the law. This medicine may cause harm and death if it is taken by other adults, children, or pets. Return medicine that has not been used to an official disposal site. Contact the DEA at 1-800-882-9539 or your  city/county government to find a site. If you cannot return the medicine, mix any unused medicine with a substance like cat litter or coffee grounds. Then throw the medicine away in a sealed container like a sealed bag or coffee can with a lid. Do not use the medicine after the expiration date. Store at room temperature between 20 and 25 degrees C (68 and 77 degrees F). Keep container tightly closed. NOTE: This sheet is a summary. It may not cover all possible information. If you have questions about this medicine, talk to your doctor, pharmacist, or health care provider.  2021 Elsevier/Gold Standard (2018-12-04 12:54:20)  

## 2020-03-22 ENCOUNTER — Ambulatory Visit: Payer: Medicaid Other | Admitting: Obstetrics & Gynecology

## 2020-03-27 ENCOUNTER — Ambulatory Visit (INDEPENDENT_AMBULATORY_CARE_PROVIDER_SITE_OTHER): Payer: Self-pay | Admitting: Obstetrics & Gynecology

## 2020-03-27 ENCOUNTER — Encounter: Payer: Self-pay | Admitting: Obstetrics & Gynecology

## 2020-03-27 ENCOUNTER — Other Ambulatory Visit: Payer: Self-pay

## 2020-03-27 VITALS — Ht 62.0 in | Wt 196.0 lb

## 2020-03-27 DIAGNOSIS — E669 Obesity, unspecified: Secondary | ICD-10-CM

## 2020-03-27 MED ORDER — PHENTERMINE HCL 37.5 MG PO TABS: ORAL_TABLET | ORAL | 1 refills | Status: DC

## 2020-03-27 NOTE — Progress Notes (Signed)
Virtual Visit via Telephone Note  I connected with Tina Gutierrez on 03/27/20 at  3:10 PM EST by telephone and verified that I am speaking with the correct person using two identifiers.  Location: Patient: Systems analyst: Office   I discussed the limitations, risks, security and privacy concerns of performing an evaluation and management service by telephone and the availability of in person appointments. I also discussed with the patient that there may be a patient responsible charge related to this service. The patient expressed understanding and agreed to proceed.   History of Present Illness:  Tina Gutierrez is a 30 y.o. who was started on Phentermine approximately 1 month ago due to obesity/abnormal weight gain. The patient has lost 15 pounds over the past 1 month due to meds and lifestyle measures..   She has these side effects: dry mouth.  PMHx: She  has a past medical history of Anemia and Ovarian cyst. Also,  has a past surgical history that includes Cholecystectomy (01/2012)., family history includes Diabetes in her maternal grandfather.,  reports that she has never smoked. She has never used smokeless tobacco. She reports that she does not drink alcohol and does not use drugs.  She has a current medication list which includes the following prescription(s): norethindrone-ethinyl estradiol, phentermine, and [DISCONTINUED] ferrous sulfate. Also, is allergic to other.  Review of Systems  All other systems reviewed and are negative.    Observations/Objective: No exam today, due to telephone eVisit due to [facility] virus restriction on elective visits and procedures.  Prior visits reviewed along with ultrasounds/labs as indicated. Home VS: Ht 5\' 2"  (1.575 m)   Wt 196 lb (88.9 kg)   BMI 35.85 kg/m   Assessment and Plan:   ICD-10-CM   1. Obesity (BMI 30-39.9)  E66.9    Assessment: obesity Medication treatment is going well for her.  Plan: Patient is continued/added to prescription  appetite suppressants: Phentermine.   Will continue to assist patient in incorporating positive experiences into her life to promote a positive mental attitude.  Education given regarding appropriate lifestyle changes for weight loss, including regular physical activity, healthy coping strategies, caloric restriction, and healthy eating patterns.  The risks and benefits as well as side effects of medication, such as Phenteramine or Tenuate, is discussed.  The pros and cons of suppressing appetite and boosting metabolism is counseled.  Risks of tolerance and addiction discussed.  Use of medicine will be short term.  Pt to call with any negative side effects and agrees to keep follow up appointments.   Follow Up Instructions: 2 mos f/u   I discussed the assessment and treatment plan with the patient. The patient was provided an opportunity to ask questions and all were answered. The patient agreed with the plan and demonstrated an understanding of the instructions.   The patient was advised to call back or seek an in-person evaluation if the symptoms worsen or if the condition fails to improve as anticipated.  I provided 15 minutes of non-face-to-face time during this encounter.   , MD

## 2020-05-24 ENCOUNTER — Ambulatory Visit: Payer: Medicaid Other | Admitting: Obstetrics & Gynecology

## 2020-06-02 ENCOUNTER — Other Ambulatory Visit: Payer: Self-pay

## 2020-06-02 ENCOUNTER — Encounter: Payer: Self-pay | Admitting: Obstetrics & Gynecology

## 2020-06-02 ENCOUNTER — Ambulatory Visit (INDEPENDENT_AMBULATORY_CARE_PROVIDER_SITE_OTHER): Payer: Medicaid Other | Admitting: Obstetrics & Gynecology

## 2020-06-02 ENCOUNTER — Other Ambulatory Visit: Payer: Self-pay | Admitting: Obstetrics & Gynecology

## 2020-06-02 VITALS — BP 100/70 | Ht 62.0 in | Wt 198.0 lb

## 2020-06-02 DIAGNOSIS — E669 Obesity, unspecified: Secondary | ICD-10-CM

## 2020-06-02 MED ORDER — DIETHYLPROPION HCL 25 MG PO TABS: 1.0000 | ORAL_TABLET | Freq: Every day | ORAL | 1 refills | Status: DC

## 2020-06-02 MED ORDER — TOPIRAMATE 25 MG PO TABS: 25.0000 mg | ORAL_TABLET | Freq: Two times a day (BID) | ORAL | 6 refills | Status: DC

## 2020-06-02 NOTE — Patient Instructions (Signed)
Diethylpropion tablets What is this medicine? DIETHYLPROPION (dye eth il PROE pee on) decreases your appetite. It is used with a reduced calorie diet and exercise to help you lose weight. This medicine is only meant to be used for a few weeks. This medicine may be used for other purposes; ask your health care provider or pharmacist if you have questions. COMMON BRAND NAME(S): Depletite # 2, Radtue, Tenuate What should I tell my health care provider before I take this medicine? They need to know if you have any of these conditions:  agitation  glaucoma  high blood pressure  history of drug dependence or substance abuse  hyperthyroid  kidney or liver disease  lung disease  valvular heart disease  an unusual or allergic reaction to diethylpropion, other medicines, foods, dyes, or preservatives  pregnant or trying to get pregnant  breast-feeding How should I use this medicine? Take this medicine by mouth with a glass of water. Follow the directions on the prescription label. Take this medicine 1 hour before meals. If a meal is missed, do not take that dose. An additional tablet may be taken in the mid evening if needed for night time hunger. Do not take your medicine more often than directed. Talk to your pediatrician regarding the use of this medicine in children. Special care may be needed. While this medicine may be prescribed for children as young as 16 years for selected conditions, precautions do apply. Overdosage: If you think you have taken too much of this medicine contact a poison control center or emergency room at once. NOTE: This medicine is only for you. Do not share this medicine with others. What if I miss a dose? If you miss a dose, take a dose as soon as you can with the next meal. If it is almost time for your next dose, take only that dose. Do not take double or extra doses. What may interact with this medicine? Do not take this medicine with any of the following  medications:  fluoxetine  MAOIs like Carbex, Eldepryl, Marplan, Nardil, and Parnate  medicines for colds or breathing difficulties like pseudoephedrine or phenylephrine  other medicines or herbal products for weight loss or to decrease appetite  procarbazine  sibutramine  stimulants like amphetamine, dextroamphetamine, dexmethylphenidate, methylphenidate or modafinil This medicine may also interact with the following medications:  general anesthetics  insulin and other medicines for diabetes  medicines for high blood pressure  phenothiazines like chlorpromazine, mesoridazine, prochlorperazine, thioridazine This list may not describe all possible interactions. Give your health care provider a list of all the medicines, herbs, non-prescription drugs, or dietary supplements you use. Also tell them if you smoke, drink alcohol, or use illegal drugs. Some items may interact with your medicine. What should I watch for while using this medicine? Visit your doctor or health care professional for regular checks on your progress. You need to closely monitor your weight loss. If your rate of weight loss slows down or stops, you may need to stop the medicine, and restart after a time without the medicine. You may get dizzy. Do not drive, use machinery, or do anything that needs mental alertness until you know how this medicine affects you. Do not stand or sit up quickly, especially if you are an older patient. This reduces the risk of dizzy or fainting spells. Alcohol may increase dizziness. Avoid alcoholic drinks. What side effects may I notice from receiving this medicine? Side effects that you should report to your doctor or  health care professional as soon as possible:  allergic reactions like skin rash, itching or hives, swelling of the face, lips, or tongue  angry, confusion, more nervous, restless  breast growth in men  breathing problems  changes in vision  chest  pain  difficulty with balance  fast, irregular heartbeat  hallucinations  nausea, vomiting  seizures  tremors  trouble sleeping Side effects that usually do not require medical attention (report to your doctor or health care professional if they continue or are bothersome):  constipation or diarrhea  dry mouth or unpleasant taste  flushing of the skin  hair loss  headache  increase or decrease in sexual desire or performance  menstrual irregularity  red or itchy skin  upset stomach This list may not describe all possible side effects. Call your doctor for medical advice about side effects. You may report side effects to FDA at 1-800-FDA-1088. Where should I keep my medicine? Keep out of the reach of children. This medicine can be abused. Keep your medicine in a safe place to protect it from theft. Do not share this medicine with anyone. Selling or giving away this medicine is dangerous and against the law. This medicine may cause accidental overdose and death if taken by other adults, children, or pets. Mix any unused medicine with a substance like cat litter or coffee grounds. Then throw the medicine away in a sealed container like a sealed bag or a coffee can with a lid. Do not use the medicine after the expiration date. Store at room temperature below 30 degrees C (86 degrees F). Protect from heat. Keep container tightly closed. NOTE: This sheet is a summary. It may not cover all possible information. If you have questions about this medicine, talk to your doctor, pharmacist, or health care provider.  2021 Elsevier/Gold Standard (2013-10-19 15:18:21)

## 2020-06-02 NOTE — Progress Notes (Signed)
  History of Present Illness:  Tina Gutierrez is a 30 y.o. who was started on Phentermine approximately 3 months ago due to obesity/abnormal weight gain. The patient reports no significant weight change. She lost 15 lbs the first month and none the last 2 mos.  She has these side effects: dry mouth.  Trying to lose weight prior to pregnancy attempts.  She is on OCPs w reg cycles currently (after Depo last year and irreg spaced periods)  PMHx: She  has a past medical history of Anemia and Ovarian cyst. Also,  has a past surgical history that includes Cholecystectomy (01/2012)., family history includes Diabetes in her maternal grandfather.,  reports that she has never smoked. She has never used smokeless tobacco. She reports that she does not drink alcohol and does not use drugs.  She has a current medication list which includes the following prescription(s): diethylpropion hcl, norethindrone-ethinyl estradiol, phentermine, topiramate, and [DISCONTINUED] ferrous sulfate. Also, is allergic to other.  Review of Systems  All other systems reviewed and are negative.   Physical Exam:  BP 100/70   Ht 5\' 2"  (1.575 m)   Wt 198 lb (89.8 kg)   LMP 05/14/2020   BMI 36.21 kg/m  Body mass index is 36.21 kg/m. Filed Weights   06/02/20 1429  Weight: 198 lb (89.8 kg)    Physical Exam Constitutional:      General: She is not in acute distress.    Appearance: She is well-developed.  Musculoskeletal:        General: Normal range of motion.  Neurological:     Mental Status: She is alert and oriented to person, place, and time.  Skin:    General: Skin is warm and dry.  Vitals reviewed.    Assessment: obesity Medication treatment is going marginally for her.  Plan: Patient is continued/added to prescription appetite suppressants: CHANGE to TENUATE and ADD TOPAMAX.   Will continue to assist patient in incorporating positive experiences into her life to promote a positive mental attitude.  Education  given regarding appropriate lifestyle changes for weight loss, including regular physical activity, healthy coping strategies, caloric restriction, and healthy eating patterns.  The risks and benefits as well as side effects of medication, such as Phenteramine or Tenuate, is discussed.  The pros and cons of suppressing appetite and boosting metabolism is counseled.  Risks of tolerance and addiction discussed.  Use of medicine will be short term.  Pt to call with any negative side effects and agrees to keep follow up appointments.   A total of 21 minutes were spent face-to-face with the patient as well as preparation, review, communication, and documentation during this encounter.   06/04/20, MD, Annamarie Major Ob/Gyn, [facility] 06/02/2020  3:14 PM

## 2020-08-07 ENCOUNTER — Ambulatory Visit: Payer: Medicaid Other | Admitting: Obstetrics & Gynecology

## 2021-01-25 ENCOUNTER — Ambulatory Visit: Payer: Medicaid Other | Admitting: Obstetrics and Gynecology

## 2021-05-02 ENCOUNTER — Ambulatory Visit: Payer: Medicaid Other | Admitting: Obstetrics & Gynecology

## 2021-05-02 ENCOUNTER — Ambulatory Visit: Payer: Medicaid Other | Admitting: Family Medicine

## 2021-05-04 ENCOUNTER — Other Ambulatory Visit: Payer: Self-pay

## 2021-05-04 ENCOUNTER — Encounter: Payer: Self-pay | Admitting: Licensed Practical Nurse

## 2021-05-04 ENCOUNTER — Other Ambulatory Visit (HOSPITAL_COMMUNITY)
Admission: RE | Admit: 2021-05-04 | Discharge: 2021-05-04 | Disposition: A | Discharge status: Home / Self Care | Payer: Medicaid Other | Source: Ambulatory Visit | Attending: Family Medicine | Admitting: Family Medicine

## 2021-05-04 ENCOUNTER — Ambulatory Visit (INDEPENDENT_AMBULATORY_CARE_PROVIDER_SITE_OTHER): Payer: Medicaid Other | Admitting: Licensed Practical Nurse

## 2021-05-04 VITALS — BP 122/62 | Ht 61.0 in | Wt 191.0 lb

## 2021-05-04 DIAGNOSIS — Z01419 Encounter for gynecological examination (general) (routine) without abnormal findings: Secondary | ICD-10-CM | POA: Insufficient documentation

## 2021-05-04 DIAGNOSIS — Z131 Encounter for screening for diabetes mellitus: Secondary | ICD-10-CM

## 2021-05-04 DIAGNOSIS — Z3169 Encounter for other general counseling and advice on procreation: Secondary | ICD-10-CM

## 2021-05-04 DIAGNOSIS — B9689 Other specified bacterial agents as the cause of diseases classified elsewhere: Secondary | ICD-10-CM | POA: Diagnosis not present

## 2021-05-04 DIAGNOSIS — Z13220 Encounter for screening for lipoid disorders: Secondary | ICD-10-CM | POA: Diagnosis not present

## 2021-05-04 DIAGNOSIS — N760 Acute vaginitis: Secondary | ICD-10-CM | POA: Diagnosis not present

## 2021-05-04 DIAGNOSIS — Z113 Encounter for screening for infections with a predominantly sexual mode of transmission: Secondary | ICD-10-CM | POA: Insufficient documentation

## 2021-05-04 NOTE — Progress Notes (Signed)
? ? ? ?Gynecology Annual Exam   ?PCP: [facility] [place] ? ?Chief Complaint:  ?Chief Complaint  ?Patient presents with  ? Gynecologic Exam  ? ? ?History of Present Illness: Patient is a 31 y.o. G0P0000 presents for annual exam. The patient has no complaints today. Is curious about her cycle.  Is also trying to conceive.  ? ?LMP: Patient's last menstrual period was 04/19/2021 (exact date). ?Average Interval: irregular, monthly but occassionally skips a cycle,  ?Duration of flow: 5 days but over the last 4 months they can last as long as 10 days ?Heavy Menses: no, uses regula tampons, but usually only needs a light pad ?Clots: no ?Intermenstrual Bleeding: no ?Postcoital Bleeding: yes, this happens around the time of her cycle, she will think her cycle has stopped, have IC and then spot or sometimes have a heavier bleed for a day or more after. The most recent time they both felt a "pop" during IC and then she had bleeding.  ?Dysmenorrhea: no ? ?Tracks cycle on Flow app, does have signs of ovulation, increased slippery clear discharge, one sided pain, and breast tenderness.  ? Was evaluated by [facility] fertility a few years ago, was told "nothing is wrong" she should be able to become pregnant ? ?The patient is sexually active with 1 female. She currently uses none for contraception. She denies dyspareunia.  The patient does perform self breast exams.  There is no notable family history of breast or ovarian cancer in her family. ? ?The patient wears seatbelts: yes.   The patient has regular exercise:  Goes to the gym once a week, likes the elliptical.  .   ?Reports not feeling stressed at all ?Works as a Programmer, multimedia work ?Lives with partner, feels safe ?Dental exam: needs apt ?Wear glasses: last exam 1 year ago ?Non smoker, no alcohol, "occasional blunt"  ? ?The patient denies current symptoms of depression.   ? ?Review of Systems: Review of Systems  ?Constitutional: Negative.    ?Respiratory: Negative.    ?Cardiovascular: Negative.   ?Gastrointestinal: Negative.   ?Genitourinary:   ?     Irregular bleeding   ?Musculoskeletal: Negative.   ?Neurological:  Positive for headaches.  ?Endo/Heme/Allergies: Negative.   ?Psychiatric/Behavioral: Negative.    ? ?Past Medical History:  ?Patient Active Problem List  ? Diagnosis Date Noted  ? Menometrorrhagia 07/05/2019  ? Symptomatic anemia   ? Abnormal uterine bleeding 06/27/2017  ? ? ?Past Surgical History:  ?Past Surgical History:  ?Procedure Laterality Date  ? CHOLECYSTECTOMY  01/2012  ? [facility]  ? ? ?Gynecologic History:  ?Patient's last menstrual period was 04/19/2021 (exact date). ?Contraception: none ?Last Pap: Results were: no abnormalities  ? ?Obstetric History: G0P0000 ? ?Family History:  ?Family History  ?Problem Relation Age of Onset  ? Diabetes Maternal Grandfather   ?     TYPE 2  ? ? ?Social History:  ?Social History  ? ?Socioeconomic History  ? Marital status: Single  ?  Spouse name: Not on file  ? Number of children: 0  ? Years of education: 19  ? Highest education level: Not on file  ?Occupational History  ? Not on file  ?Tobacco Use  ? Smoking status: Never  ? Smokeless tobacco: Never  ?Vaping Use  ? Vaping Use: Never used  ?Substance and Sexual Activity  ? Alcohol use: No  ? Drug use: Never  ? Sexual activity: Yes  ?  Birth control/protection: Pill  ?Other Topics Concern  ?  Not on file  ?Social History Narrative  ? Not on file  ? ?Social Determinants of Health  ? ?Financial Resource Strain: Not on file  ?Food Insecurity: Not on file  ?Transportation Needs: Not on file  ?Physical Activity: Not on file  ?Stress: Not on file  ?Social Connections: Not on file  ?Intimate Partner Violence: Not on file  ? ? ?Allergies:  ?Allergies  ?Allergen Reactions  ? Other Itching  ?  Pistachios = severe swelling, walnuts, bananas, apples = itching  ? ? ?Medications: ?Prior to Admission medications   ?Medication Sig Start Date End Date Taking? Authorizing  Provider  ?Diethylpropion HCl 25 MG TABS Take 1 tablet (25 mg total) by mouth daily before breakfast. ?Patient not taking: Reported on 05/04/2021 06/02/20   Gae Dry, MD  ?norethindrone-ethinyl estradiol (JUNEL FE 1/20) 1-20 MG-MCG tablet Take 1 tablet by mouth daily. ?Patient not taking: Reported on 05/04/2021 [id number]   Copland, Elmo Putt B, PA-C  ?phentermine (ADIPEX-P) 37.5 MG tablet One tablet po in morning. ?Patient not taking: Reported on 05/04/2021 03/27/20   Gae Dry, MD  ?topiramate (TOPAMAX) 25 MG tablet TAKE 1 TABLET BY MOUTH TWICE A DAY ?Patient not taking: Reported on 05/04/2021 06/02/20   Gae Dry, MD  ?ferrous sulfate 325 (65 FE) MG tablet Take 325 mg by mouth daily with breakfast. ?Patient not taking: Reported on 12/24/2019  12/24/19  [provider]  ? ? ?Physical Exam ?Vitals: Blood pressure 122/62, height 5\' 1"  (1.549 m), weight 191 lb (86.6 kg), last menstrual period 04/19/2021. ? ?General: NAD ?HEENT: normocephalic, anicteric ?Thyroid: no enlargement, no palpable nodules ?Pulmonary: No increased work of breathing, CTAB ?Cardiovascular: RRR, distal pulses 2+ ?Breast: Breast symmetrical, no tenderness, no palpable nodules or masses, no skin or nipple retraction present, no nipple discharge.  No axillary or supraclavicular lymphadenopathy. ?Abdomen: NABS, soft, non-tender, non-distended.  Umbilicus without lesions.  No hepatomegaly, splenomegaly or masses palpable. No evidence of hernia  ?Genitourinary: ? External: Normal external female genitalia.  Normal urethral meatus, normal Bartholin's and Skene's glands.   ? Vagina: Normal vaginal mucosa, no evidence of prolapse.   ? Cervix: Grossly normal in appearance, no bleeding ? Uterus: Non-enlarged, mobile, normal contour.  No CMT ? Adnexa: ovaries non-enlarged, no adnexal masses ? Rectal: deferred ? Lymphatic: no evidence of inguinal lymphadenopathy ?Extremities: no edema, erythema, or tenderness ?Neurologic: Grossly  intact ?Psychiatric: mood appropriate, affect full ? ? ?Assessment: 31 y.o. G0P0000 routine annual exam ? ?Plan: ?Problem List Items Addressed This Visit   ?None ?Visit Diagnoses   ? ? Well woman exam    -  Primary  ? Relevant Orders  ? HEP, RPR, HIV Panel  ? Rubella screen  ? Varicella zoster antibody, IgG  ? CBC  ? Hemoglobin A1c  ? Hepatitis C antibody  ? Cervicovaginal ancillary only  ? Lipid panel  ? Screening for cholesterol level      ? Relevant Orders  ? Lipid panel  ? Screening examination for venereal disease      ? Relevant Orders  ? HEP, RPR, HIV Panel  ? Screening for diabetes mellitus      ? Relevant Orders  ? HEP, RPR, HIV Panel  ? ?  ? ? ?2) STI screening  wasoffered and accepted ? ?2)  ASCCP guidelines and rational discussed.  Patient opts for every 3 years screening interval due in 2024 ? ?3) Contraception - the patient is currently using  none.  She is attempting to conceive  in the near future ? ?4) Routine healthcare maintenance including cholesterol, diabetes screening discussed Ordered today ? ?5) Preconception: start PNV and folic acid now, try to get to a more optimum weight, See [facility] fertility if no conception after another 6 months of regular timed IC and not pregnancy. Once pregnant, schedule NOB around 8 weeks.  ? ?6) annual exam due in 1 year  ? ?Roberto Scales, CNM  ?[facility] ?05/04/2021, 10:45 AM ? ? ?  ?

## 2021-05-05 LAB — CBC
Hematocrit: 43.7 % (ref 34.0–46.6)
Hemoglobin: 13.5 g/dL (ref 11.1–15.9)
MCH: 23.2 pg — ABNORMAL LOW (ref 26.6–33.0)
MCHC: 30.9 g/dL — ABNORMAL LOW (ref 31.5–35.7)
MCV: 75 fL — ABNORMAL LOW (ref 79–97)
Platelets: 354 10*3/uL (ref 150–450)
RBC: 5.82 x10E6/uL — ABNORMAL HIGH (ref 3.77–5.28)
RDW: 15.5 % — ABNORMAL HIGH (ref 11.7–15.4)
WBC: 6 10*3/uL (ref 3.4–10.8)

## 2021-05-05 LAB — LIPID PANEL
Chol/HDL Ratio: 4.8 ratio — ABNORMAL HIGH (ref 0.0–4.4)
Cholesterol, Total: 158 mg/dL (ref 100–199)
HDL: 33 mg/dL — ABNORMAL LOW (ref >39)
LDL Chol Calc (NIH): 87 mg/dL (ref 0–99)
Triglycerides: 225 mg/dL — ABNORMAL HIGH (ref 0–149)
VLDL Cholesterol Cal: 38 mg/dL (ref 5–40)

## 2021-05-05 LAB — HEP, RPR, HIV PANEL
HIV Screen 4th Generation wRfx: NONREACTIVE
Hepatitis B Surface Ag: NEGATIVE
RPR Ser Ql: NONREACTIVE

## 2021-05-05 LAB — VARICELLA ZOSTER ANTIBODY, IGG: Varicella zoster IgG: 530 index (ref >165)

## 2021-05-05 LAB — HEMOGLOBIN A1C
Est. average glucose Bld gHb Est-mCnc: 111 mg/dL
Hgb A1c MFr Bld: 5.5 % (ref 4.8–5.6)

## 2021-05-05 LAB — HEPATITIS C ANTIBODY: Hep C Virus Ab: NONREACTIVE

## 2021-05-05 LAB — RUBELLA SCREEN: Rubella Antibodies, IGG: 4.67 index (ref >0.99)

## 2021-05-08 ENCOUNTER — Encounter: Payer: Self-pay | Admitting: Licensed Practical Nurse

## 2021-05-08 ENCOUNTER — Other Ambulatory Visit: Payer: Self-pay | Admitting: Licensed Practical Nurse

## 2021-05-08 DIAGNOSIS — B9689 Other specified bacterial agents as the cause of diseases classified elsewhere: Secondary | ICD-10-CM

## 2021-05-08 LAB — CERVICOVAGINAL ANCILLARY ONLY
Bacterial Vaginitis (gardnerella): POSITIVE — AB
Chlamydia: NEGATIVE
Comment: NEGATIVE
Comment: NEGATIVE
Comment: NEGATIVE
Comment: NORMAL
Neisseria Gonorrhea: NEGATIVE
Trichomonas: NEGATIVE

## 2021-05-08 MED ORDER — METRONIDAZOLE 500 MG PO TABS: 500.0000 mg | ORAL_TABLET | Freq: Two times a day (BID) | ORAL | 0 refills | Status: DC

## 2021-05-08 NOTE — Progress Notes (Signed)
Attempted to call pt to review labs, someone picked up but I was not able to communicate with that person.  Mychart message sent.  Script for flagyl sent to pharmacy on file.  ?Carie Caddy, CNM  ?[facility], [facility]  ?05/08/21  ?1:34 PM  ? ?

## 2021-09-01 IMAGING — US US PELVIS COMPLETE
1 series · 13 of 25 positions shown · non-contrast
Comparison: Pelvic ultrasound dated June 27, 2017.

CLINICAL DATA: Vaginal bleeding for the past 4 months.

EXAM:
TRANSABDOMINAL AND TRANSVAGINAL ULTRASOUND OF PELVIS
TECHNIQUE: Both transabdominal and transvaginal ultrasound examinations of the
pelvis were performed. Transabdominal technique was performed for
global imaging of the pelvis including uterus, ovaries, adnexal
regions, and pelvic cul-de-sac. It was necessary to proceed with
endovaginal exam following the transabdominal exam to visualize the
endometrium and ovaries.

[Series 1: us pelvis (transabdominal only) · 13 of 175 slices shown]
[im 1/175]
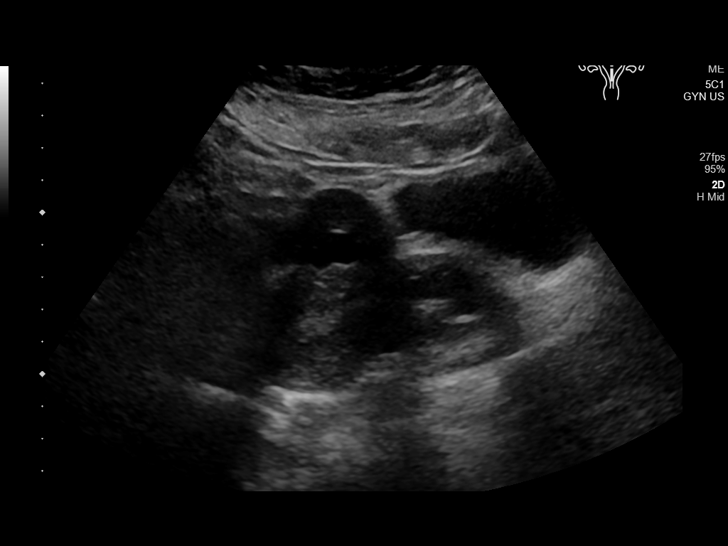
[im 15/175]
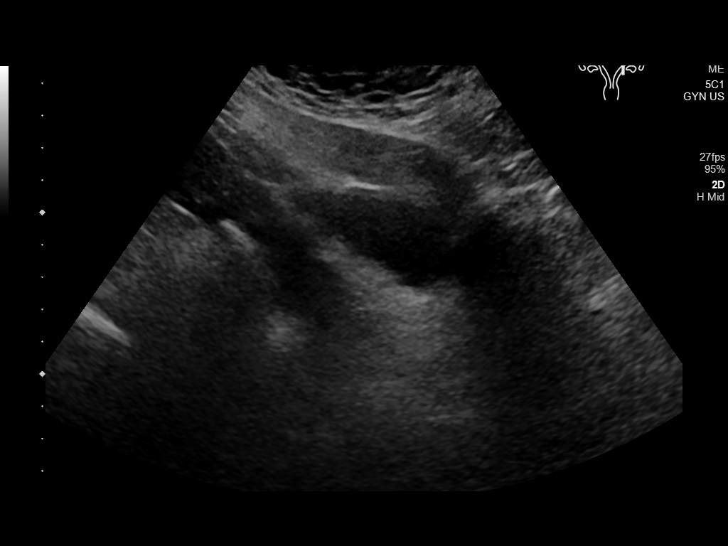
[im 30/175]
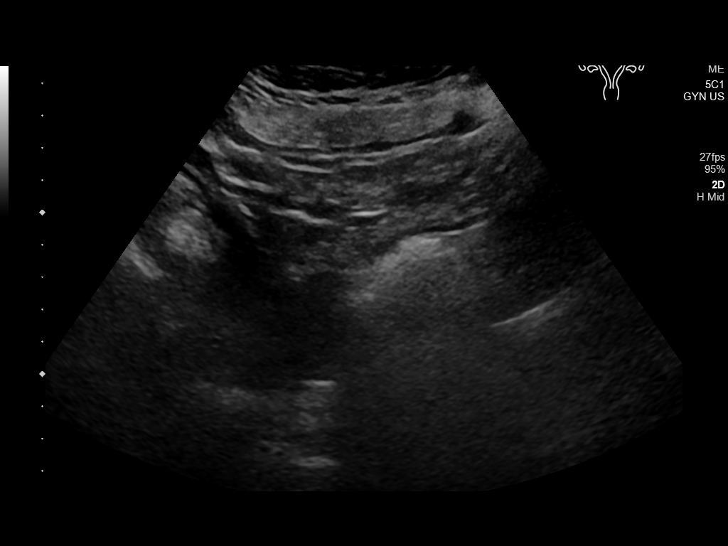
[im 44/175]
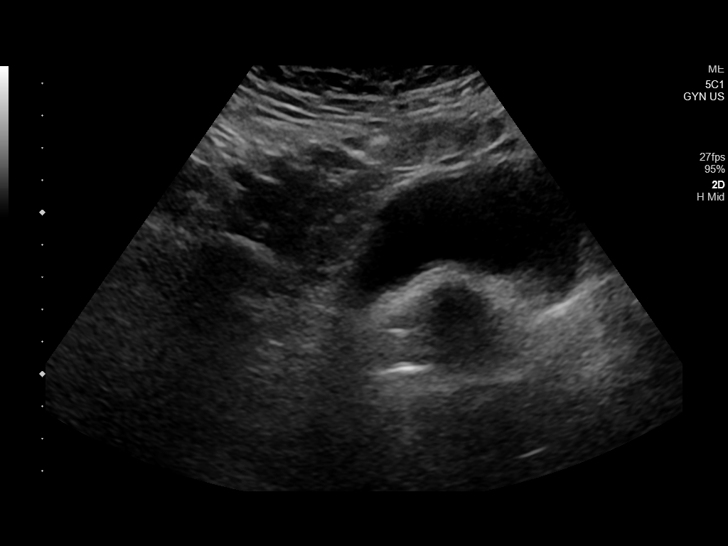
[im 59/175]
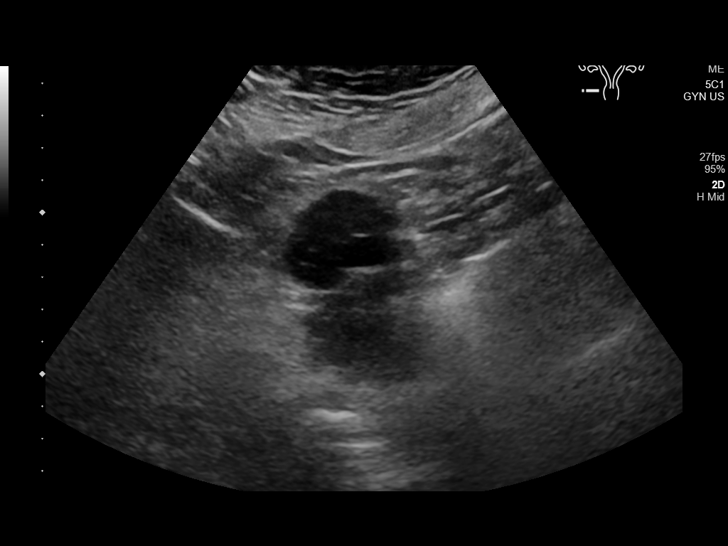
[im 73/175]
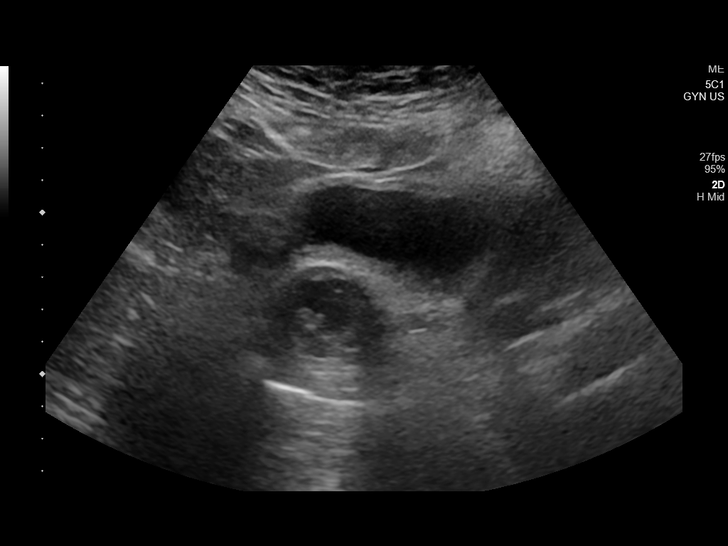
[im 88/175]
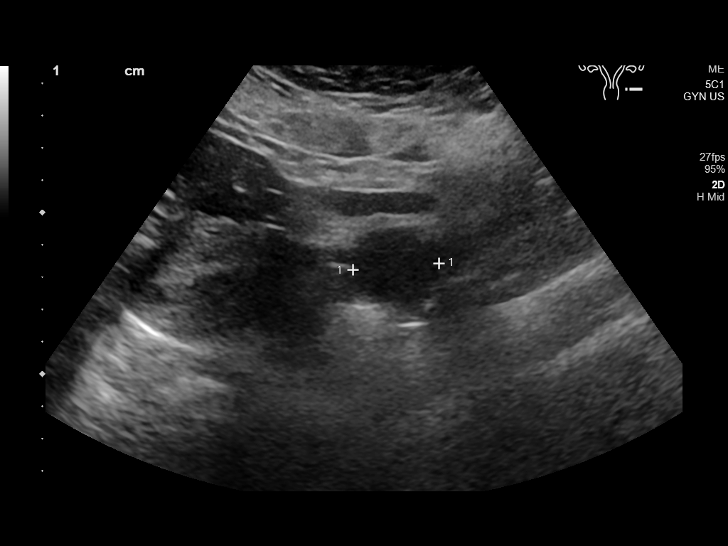
[im 102/175]
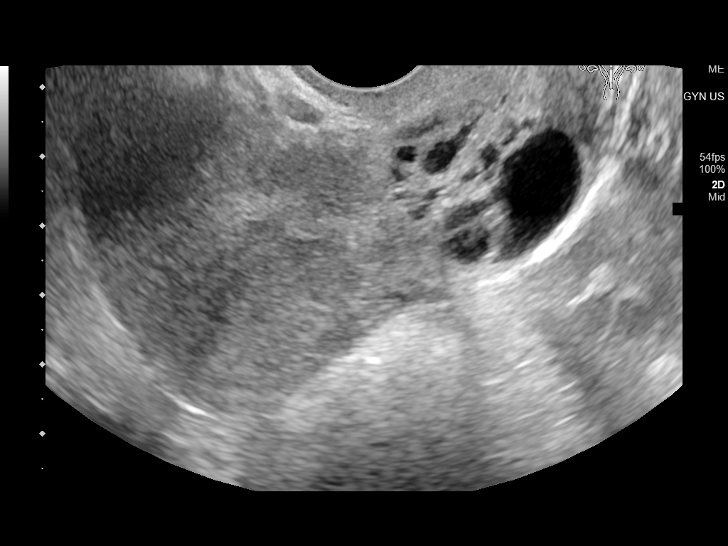
[im 117/175]
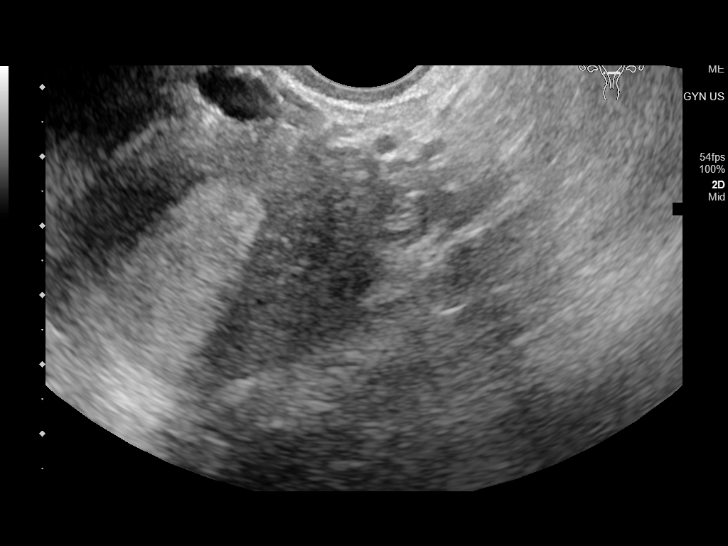
[im 131/175]
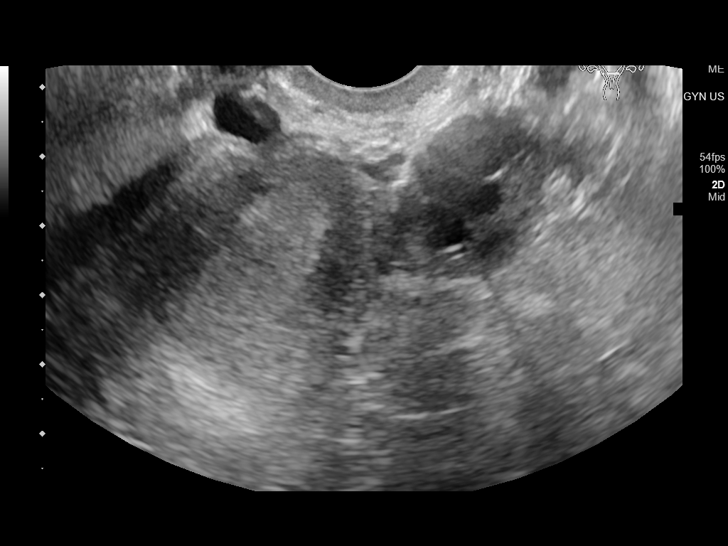
[im 146/175]
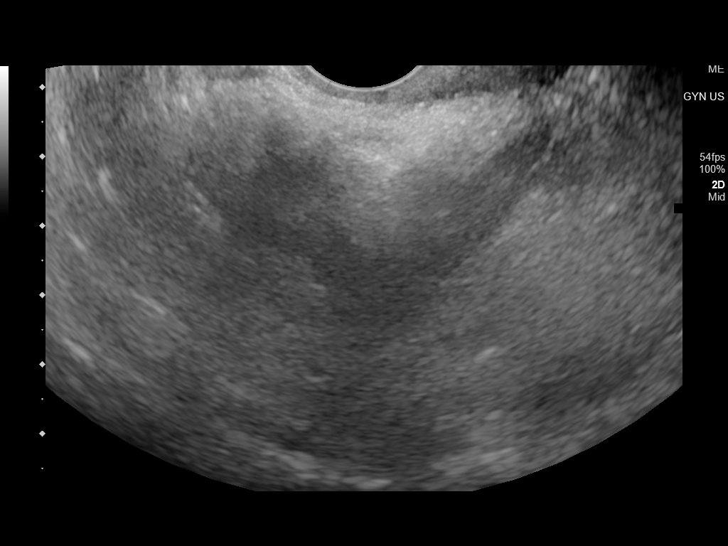
[im 160/175]
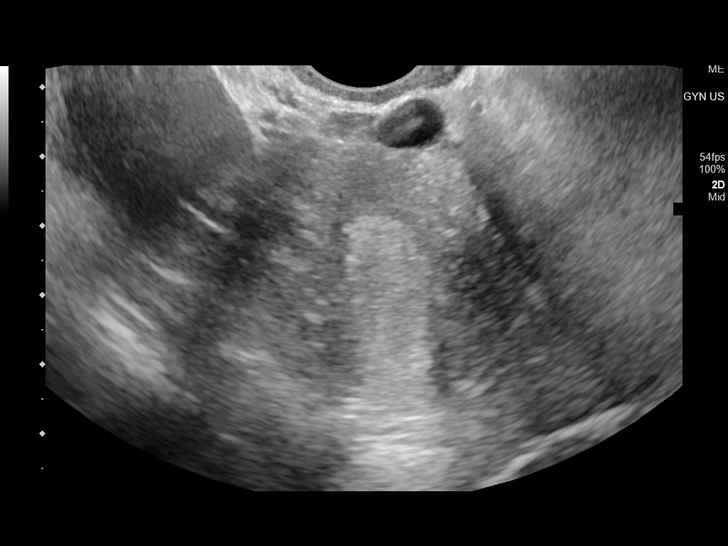
[im 175/175]
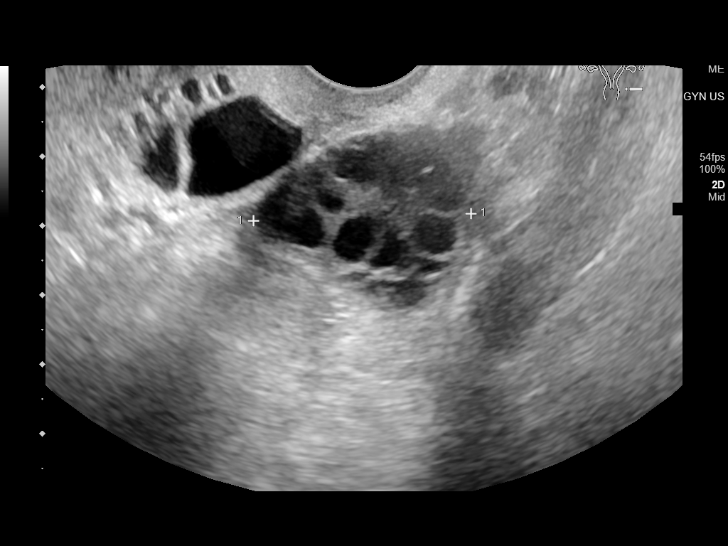

[13 of 25 positions shown; findings below may reference images not displayed]

FINDINGS: Uterus

Measurements: 6.3 x 5.0 x 4.2 cm = volume: 69 mL. Retroverted. No
fibroids or other mass visualized. Multiple nabothian cysts again
noted.

Endometrium

Thickness: 15 mm.  No focal abnormality visualized.

Right ovary

Measurements: 4.1 x 2.7 x 3.7 cm = volume: 21 mL. Multiple
peripheral follicles noted. No adnexal mass.

Left ovary

Measurements: 3.4 x 2.7 x 3.1 cm = volume: 15 mL. Multiple
peripheral follicles noted. No adnexal mass.

Other findings

No abnormal free fluid.
IMPRESSION: 1. Borderline endometrial thickening measuring 15 mm. If bleeding
remains unresponsive to hormonal or medical therapy, sonohysterogram
should be considered for focal lesion work-up. (Ref: Radiological
Reasoning: Algorithmic Workup of Abnormal Vaginal Bleeding with
Endovaginal Sonography and Sonohysterography. AJR [id number]; 191:S68-73)
2. Polycystic ovaries, which can be a normal variant. Correlation
for polycystic ovarian syndrome is recommended.

## 2021-10-31 ENCOUNTER — Ambulatory Visit
Admission: EM | Admit: 2021-10-31 | Discharge: 2021-10-31 | Disposition: A | Discharge status: Home / Self Care | Payer: Medicaid Other | Attending: Physician Assistant | Admitting: Physician Assistant

## 2021-10-31 ENCOUNTER — Encounter: Payer: Self-pay | Admitting: Emergency Medicine

## 2021-10-31 ENCOUNTER — Ambulatory Visit (INDEPENDENT_AMBULATORY_CARE_PROVIDER_SITE_OTHER): Payer: Self-pay

## 2021-10-31 ENCOUNTER — Ambulatory Visit
Admit: 2021-10-31 | Discharge: 2021-10-31 | Disposition: A | Discharge status: Home / Self Care | Payer: Self-pay | Attending: *Deleted | Admitting: *Deleted

## 2021-10-31 DIAGNOSIS — R109 Unspecified abdominal pain: Secondary | ICD-10-CM

## 2021-10-31 DIAGNOSIS — R110 Nausea: Secondary | ICD-10-CM | POA: Insufficient documentation

## 2021-10-31 DIAGNOSIS — R102 Pelvic and perineal pain: Secondary | ICD-10-CM | POA: Insufficient documentation

## 2021-10-31 DIAGNOSIS — N83202 Unspecified ovarian cyst, left side: Secondary | ICD-10-CM

## 2021-10-31 DIAGNOSIS — N83201 Unspecified ovarian cyst, right side: Secondary | ICD-10-CM | POA: Insufficient documentation

## 2021-10-31 LAB — URINALYSIS, ROUTINE W REFLEX MICROSCOPIC
Bilirubin Urine: NEGATIVE
Glucose, UA: NEGATIVE mg/dL
Hgb urine dipstick: NEGATIVE
Ketones, ur: NEGATIVE mg/dL
Leukocytes,Ua: NEGATIVE
Nitrite: NEGATIVE
Protein, ur: NEGATIVE mg/dL
Specific Gravity, Urine: 1.02 (ref 1.005–1.030)
pH: 5.5 (ref 5.0–8.0)

## 2021-10-31 LAB — PREGNANCY, URINE: Preg Test, Ur: NEGATIVE

## 2021-10-31 NOTE — ED Triage Notes (Signed)
Pt c/o LLQ abdominal pain, and lower back pain. Started about 2 weeks ago. She states the pain is so intense that she doubles over and gets chills. She states the pain is in intermittent but is getting more frequent. She states she has tried Midol since it is a cramping pain but no relief. She does not have pain now but when she got up this morning the pain was bad. She states she has nausea but no vomiting. Denies diarrhea or urinary symptoms.

## 2021-10-31 NOTE — ED Provider Notes (Signed)
[facility]    CSN: [id number] Arrival date & time: 10/31/21  1517      History   Chief Complaint Chief Complaint  Patient presents with   Abdominal Pain    HPI Tina Gutierrez is a 31 y.o. female presenting for approximately 2-week history of left lower quadrant pain with radiation to the left lower back and nausea without vomiting.  Patient says pain is worse with putting pressure on the left side and laying on the left side.  She also reports pain seems to be worse in the morning.  She says it is intermittent but has been becoming more frequent over the past few days.  She reports that she has menstrual periods at the same time every month, usually between the second and the seventh.  She reports that they have been lighter than normal.  She also reports a couple days ago she woke up with severe and sharp lower abdominal pain and then went to the bathroom and noticed vaginal bleeding.  She says it was only 1 episode but it "gushed out."  She denies any abnormal vaginal discharge or STIs. No concern for STIs at all.  Denies dysuria, frequency or urgency.  Denies any sort of injury to her back or abdomen.  She does have a history of ovarian cysts but says it was a long time ago.  She reports they resolved on their own.  Patient has followed up with OB/GYN and her last appointment was about 6 months ago.  Reports she had labs done and everything was "normal."  Patient does report having solid stools for the past couple of weeks which is a little abnormal since she had a cholecystectomy.  She reports over the past couple of days she has had hard and small stools.  She has tried Midol for the pain which has not really helped.  No other complaints.    HPI  Past Medical History:  Diagnosis Date   Anemia    Ovarian cyst     Patient Active Problem List   Diagnosis Date Noted   Menometrorrhagia 07/05/2019   Symptomatic anemia    Abnormal uterine bleeding 06/27/2017    Past  Surgical History:  Procedure Laterality Date   CHOLECYSTECTOMY  01/2012   [facility]    OB History     Gravida  0   Para  0   Term  0   Preterm  0   AB  0   Living  0      SAB  0   IAB  0   Ectopic  0   Multiple  0   Live Births  0            Home Medications    Prior to Admission medications   Medication Sig Start Date End Date Taking? Authorizing Provider  Diethylpropion HCl 25 MG TABS Take 1 tablet (25 mg total) by mouth daily before breakfast. Patient not taking: Reported on 05/04/2021 06/02/20   Nadara Mustard, MD  metroNIDAZOLE (FLAGYL) 500 MG tablet Take 1 tablet (500 mg total) by mouth 2 (two) times daily. 05/08/21   Dominic, Courtney Heys, CNM  norethindrone-ethinyl estradiol (JUNEL FE 1/20) 1-20 MG-MCG tablet Take 1 tablet by mouth daily. Patient not taking: Reported on 05/04/2021 02/22/20   Copland, Ilona Sorrel, PA-C  phentermine (ADIPEX-P) 37.5 MG tablet One tablet po in morning. Patient not taking: Reported on 05/04/2021 03/27/20   Nadara Mustard, MD  topiramate (TOPAMAX) 25  MG tablet TAKE 1 TABLET BY MOUTH TWICE A DAY Patient not taking: Reported on 05/04/2021 06/02/20   Gae Dry, MD  ferrous sulfate 325 (65 FE) MG tablet Take 325 mg by mouth daily with breakfast. Patient not taking: Reported on 12/24/2019  12/24/19  [provider]    Family History Family History  Problem Relation Age of Onset   Diabetes Maternal Grandfather        TYPE 2    Social History Social History   Tobacco Use   Smoking status: Never   Smokeless tobacco: Never  Vaping Use   Vaping Use: Never used  Substance Use Topics   Alcohol use: No   Drug use: Never     Allergies   Other   Review of Systems Review of Systems  Constitutional:  Negative for appetite change, fatigue and fever.  Respiratory:  Negative for shortness of breath.   Cardiovascular:  Negative for chest pain.  Gastrointestinal:  Positive for abdominal pain and nausea. Negative for  blood in stool, diarrhea, rectal pain and vomiting.  Genitourinary:  Negative for difficulty urinating, dysuria, frequency and menstrual problem.  Musculoskeletal:  Positive for back pain.  Neurological:  Negative for weakness and numbness.     Physical Exam Triage Vital Signs ED Triage Vitals  Enc Vitals Group     BP 10/31/21 0912 125/78     Pulse Rate 10/31/21 0912 68     Resp 10/31/21 0912 16     Temp 10/31/21 0912 98.4 F (36.9 C)     Temp Source 10/31/21 0912 Oral     SpO2 10/31/21 0912 98 %     Weight 10/31/21 0910 190 lb 14.7 oz (86.6 kg)     Height 10/31/21 0910 5\' 1"  (1.549 m)     Head Circumference --      Peak Flow --      Pain Score 10/31/21 0909 0     Pain Loc --      Pain Edu? --      Excl. in [place]? --    No data found.  Updated Vital Signs BP 125/78 (BP Location: Left Arm)   Pulse 68   Temp 98.4 F (36.9 C) (Oral)   Resp 16   Ht 5\' 1"  (1.549 m)   Wt 190 lb 14.7 oz (86.6 kg)   LMP 10/13/2021 Comment: denies preg, neg preg test today  SpO2 98%   BMI 36.07 kg/m       Physical Exam Vitals and nursing note reviewed.  Constitutional:      General: She is not in acute distress.    Appearance: Normal appearance. She is well-developed. She is not ill-appearing or toxic-appearing.  HENT:     Head: Normocephalic and atraumatic.  Eyes:     General: No scleral icterus.       Right eye: No discharge.        Left eye: No discharge.     Conjunctiva/sclera: Conjunctivae normal.  Cardiovascular:     Rate and Rhythm: Normal rate and regular rhythm.     Heart sounds: Normal heart sounds.  Pulmonary:     Effort: Pulmonary effort is normal. No respiratory distress.     Breath sounds: Normal breath sounds.  Abdominal:     Palpations: Abdomen is soft.     Tenderness: There is abdominal tenderness (LLQ). There is left CVA tenderness. There is no right CVA tenderness.  Musculoskeletal:     Cervical back: Neck supple.  Skin:  General: Skin is dry.   Neurological:     General: No focal deficit present.     Mental Status: She is alert. Mental status is at baseline.     Motor: No weakness.     Gait: Gait normal.  Psychiatric:        Mood and Affect: Mood normal.        Behavior: Behavior normal.        Thought Content: Thought content normal.      UC Treatments / Results  Labs (all labs ordered are listed, but only abnormal results are displayed) Labs Reviewed  URINALYSIS, ROUTINE W REFLEX MICROSCOPIC  PREGNANCY, URINE    EKG   Radiology US PELVIC COMPLETE W TRANSVAGINAL AND TORSION R/O  Result Date: 10/31/2021 CLINICAL DATA:  Two weeks of left-sided pelvic pain. EXAM: TRANSABDOMINAL AND TRANSVAGINAL ULTRASOUND OF PELVIS DOPPLER ULTRASOUND OF OVARIES TECHNIQUE: Both transabdominal and transvaginal ultrasound examinations of the pelvis were performed. Transabdominal technique was performed for global imaging of the pelvis including uterus, ovaries, adnexal regions, and pelvic cul-de-sac. It was necessary to proceed with endovaginal exam following the transabdominal exam to visualize the uterus, endometrium and ovaries. Color and duplex Doppler ultrasound was utilized to evaluate blood flow to the ovaries. COMPARISON:  Ultrasound Jul 05, 2019 and CT abdomen pelvis September 13, 2019 FINDINGS: Uterus Measurements: 7.3 x 4.4 x 3.9 cm = volume: 64 mL. No fibroids or other mass visualized. Cervical nabothian cysts. Endometrium Thickness: 11 mm.  No focal abnormality visualized. Right ovary Measurements: 3.8 x 3.2 x 2.4 cm = volume: 15 mL. Multiple anechoic ovarian follicles, similar prior. Normal appearance/no adnexal mass. Left ovary Measurements: 3.7 x 2.7 x 2.0 cm = volume: 10 mL. Multiple anechoic ovarian follicles, similar prior. Normal appearance/no adnexal mass. Pulsed Doppler evaluation of both ovaries demonstrates normal low-resistance arterial and venous waveforms. Other findings No abnormal free fluid. IMPRESSION: 1. No acute  abnormality, specifically no evidence of ovarian torsion. 2. Multiple prominent follicles in the bilateral ovaries as can be seen with polycystic ovarian syndrome, suggest clinical correlation. Electronically Signed   By: Maudry Mayhew M.D.   On: 10/31/2021 15:29   DG Abdomen 1 View  Result Date: 10/31/2021 CLINICAL DATA:  Left flank pain for 2 weeks EXAM: ABDOMEN - 1 VIEW COMPARISON:  CT 09/13/2019 FINDINGS: The bowel gas pattern is normal. No radio-opaque calculi or other significant radiographic abnormality are seen. Osseous structures are intact and unremarkable. IMPRESSION: Negative. Electronically Signed   By: Duanne Guess D.O.   On: 10/31/2021 10:03    Procedures Procedures (including critical care time)  Medications Ordered in UC Medications - No data to display  Initial Impression / Assessment and Plan / UC Course  I have reviewed the triage vital signs and the nursing notes.  Pertinent labs & imaging results that were available during my care of the patient were reviewed by me and considered in my medical decision making (see chart for details).   31 year old female presenting for 2-week history of left lower quadrant pain and nausea without vomiting.  Also reports change in bowel habits.  Has had solid and sometimes hard stools especially over the past couple of days.  Also reports lighter menstrual periods, occurring about the same time every month.  Reports episode of vaginal bleeding a few days ago with severe abdominal pain at that time.  History of ovarian cyst when she was younger.  Vitals are normal and stable and she is overall well-appearing.  Abdomen is  soft and she has tenderness palpation of the left lower quadrant and subjective left CVA tenderness.  Urine pregnancy negative.  Urinalysis normal.  KUB obtained to assess for possible renal stone versus constipation.  Will consider ordering pelvic ultrasound if x-ray unrevealing as to the cause of her symptoms.  X-ray  without any acute abnormality.  Will order ultrasound to rule out ovarian cyst/torsion, check for fibroids, etc. advised patient we will contact her with the results after she has the ultrasound.  Ultrasound Pelvic/Transvaginal: Result Date: 10/31/2021 IMPRESSION: 1. No acute abnormality, specifically no evidence of ovarian torsion. 2. Multiple prominent follicles in the bilateral ovaries as can be seen with polycystic ovarian syndrome, suggest clinical correlation. Electronically Signed   By: Maudry MayhewJeffrey  Waltz M.D.   On: 10/31/2021 15:29   Reviewed ultrasound result which shows no acute abnormality and no evidence of torsion.  She does have multiple cysts on ovaries which look to be consistent with PCOS.  I do not see an official diagnosis of this in her chart.  I did try to call patient with results but there was an issue with her phone number and unable to leave a voicemail.  I contacted her via MyChart.  Advised patient to make a follow-up appoint with OB/GYN.  [facility]  We discussed treating pain with Tylenol, heat and going to emergency department for any acute worsening of pain, otherwise to follow-up with OB/GYN.   Final Clinical Impressions(s) / UC Diagnoses   Final diagnoses:  Pelvic pain in female  Nausea without vomiting  Bilateral ovarian cysts     Discharge Instructions      -Negative pregnancy and normal urine. - No sign of kidney stone or constipation/bowel obstruction on your x-ray. - Return at 215 to have your ultrasound which is scheduled at 230.  I will call you when I get the results.  Depending on the results I may be able to refer you back to OB/GYN and treat your pain or may have to send you to the emergency department.  If your pain worsens before 230, go directly to the ER.     ED Prescriptions   None    PDMP not reviewed this encounter.   Shirlee Latchaves, Orville Mena B, PA-C 11/01/21 [phone]

## 2021-10-31 NOTE — Discharge Instructions (Signed)
-  Negative pregnancy and normal urine. - No sign of kidney stone or constipation/bowel obstruction on your x-ray. - Return at 215 to have your ultrasound which is scheduled at 230.  I will call you when I get the results.  Depending on the results I may be able to refer you back to OB/GYN and treat your pain or may have to send you to the emergency department.  If your pain worsens before 230, go directly to the ER.

## 2021-11-01 ENCOUNTER — Telehealth: Payer: Self-pay

## 2021-11-01 NOTE — Telephone Encounter (Signed)
Pt went to urgent care yesterday for severe cramping and pain. U/s done. Urgent care told her to follow up with her OB/GYN to discuss possible PCOS. Can someone help get a follow up appt scheduled for this pt?

## 2021-11-05 NOTE — Telephone Encounter (Signed)
Patient is scheduled for 11/16/21 with LMD

## 2021-11-10 IMAGING — CT CT ABD-PELV W/ CM
2 of 4 series · 17 of 46 positions shown, 19 images · IV contrast (APPLIED)
Comparison: Pelvis ultrasound 06/15/2019. CT Abdomen and Pelvis
05/04/2014.

CLINICAL DATA: 28-year-old female with abdominal pain, pelvic pain
for 3 days. Nausea. Vaginal bleeding. Negative pregnancy test.
Leukocytosis.

EXAM:
CT ABDOMEN AND PELVIS WITH CONTRAST
TECHNIQUE: Multidetector CT imaging of the abdomen and pelvis was performed
using the standard protocol following bolus administration of
intravenous contrast.
CONTRAST:  100mL OMNIPAQUE IOHEXOL 300 MG/ML  SOLN

[Series 2: routine abd/pel with · axial · 0.70mm/px · z∈[-947,-537]mm · 14 of 90 slices shown, 16 images]
[im 4/90  soft-tissue]
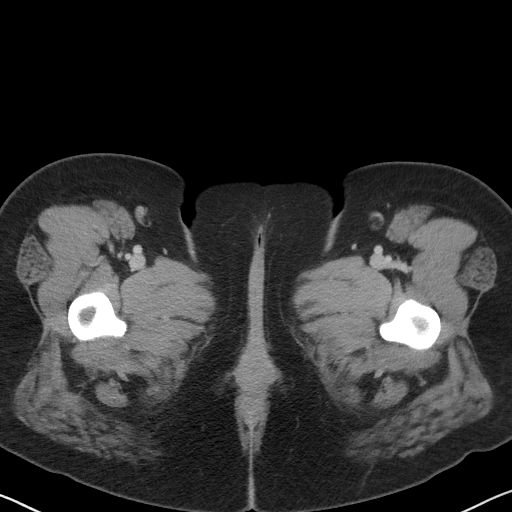
[im 4/90  bone]
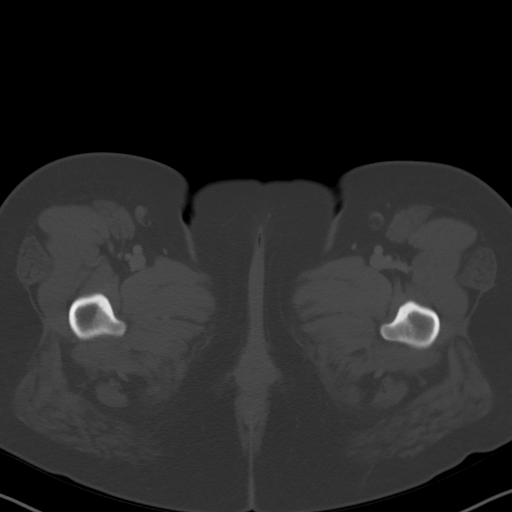
[im 11/90  soft-tissue]
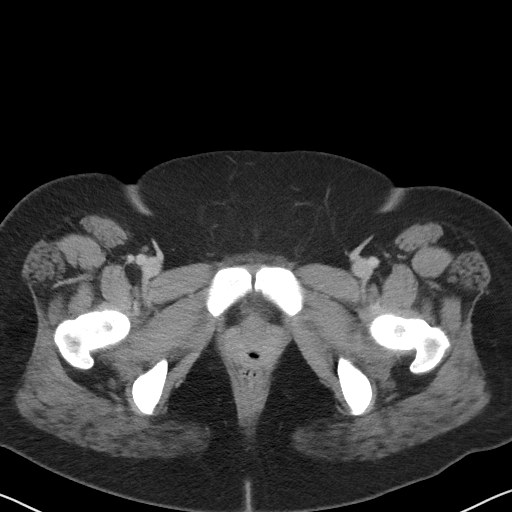
[im 18/90  soft-tissue]
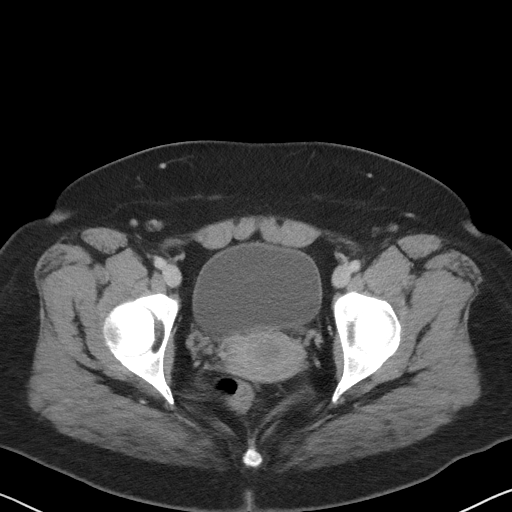
[im 25/90  soft-tissue]
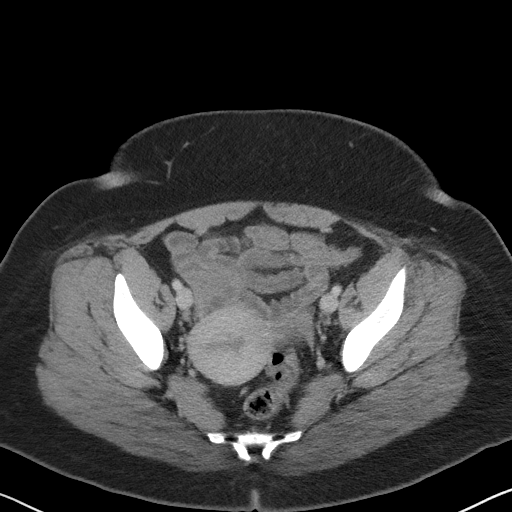
[im 29/90  soft-tissue]
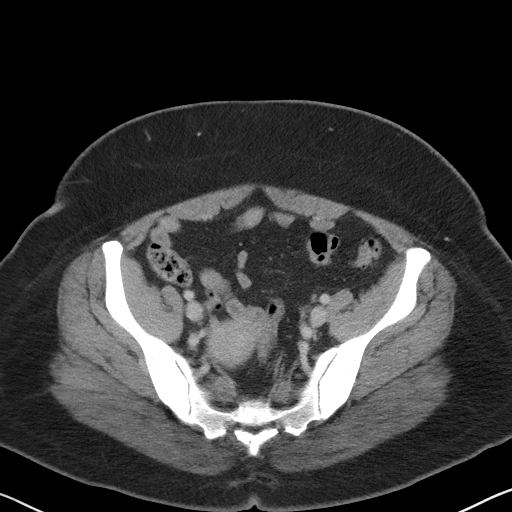
[im 36/90  soft-tissue]
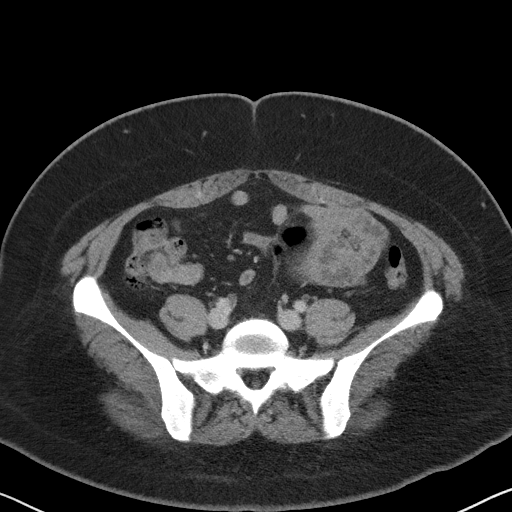
[im 43/90  soft-tissue]
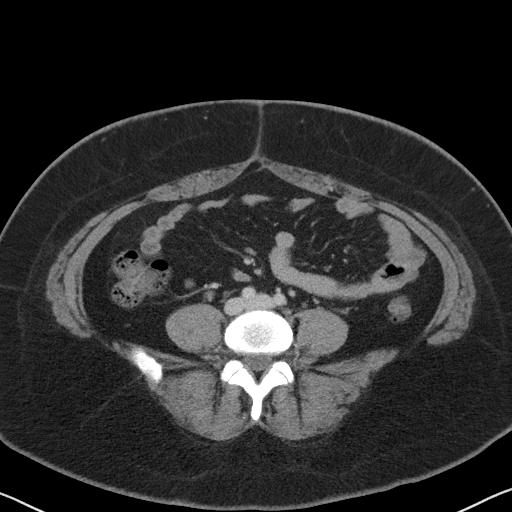
[im 47/90  soft-tissue]
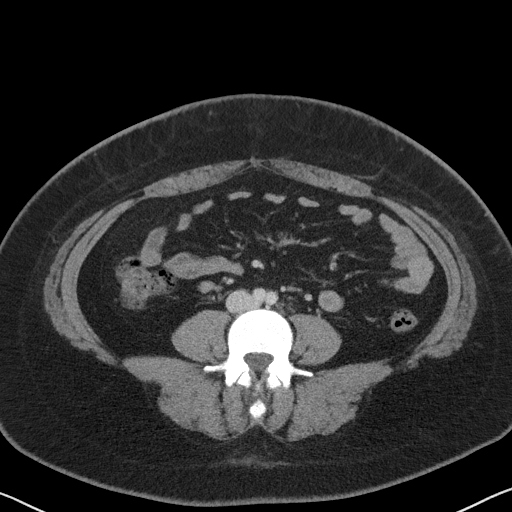
[im 54/90  soft-tissue]
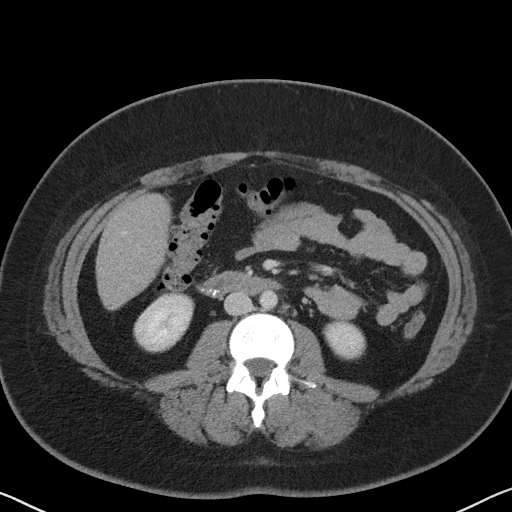
[im 54/90  bone]
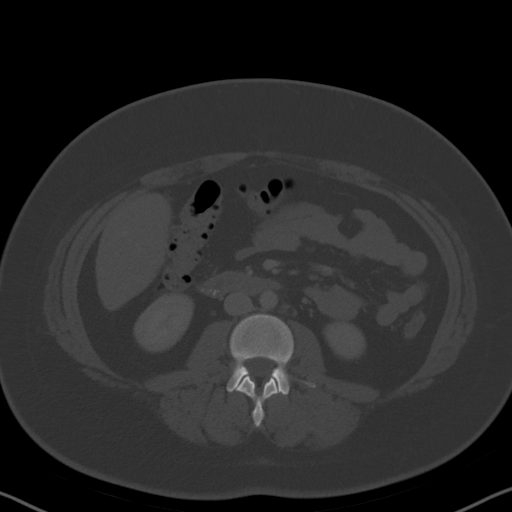
[im 61/90  soft-tissue]
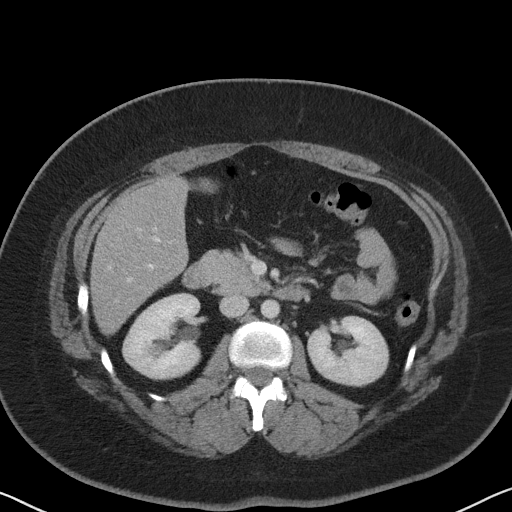
[im 68/90  soft-tissue]
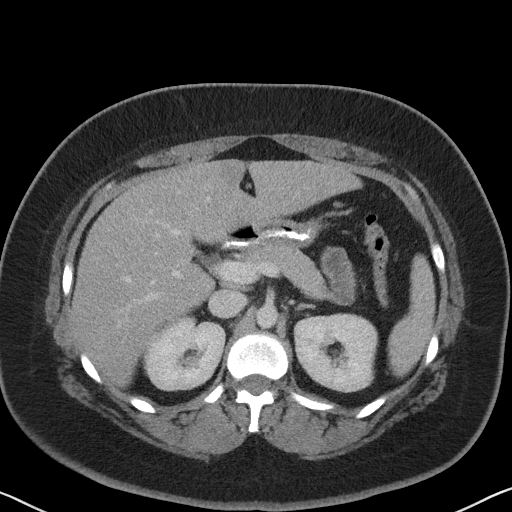
[im 72/90  soft-tissue]
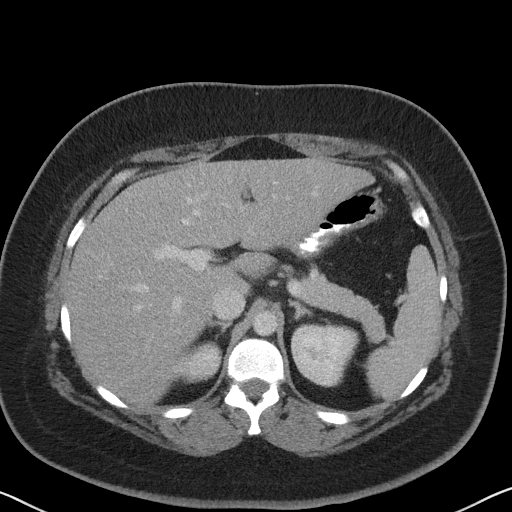
[im 79/90  soft-tissue]
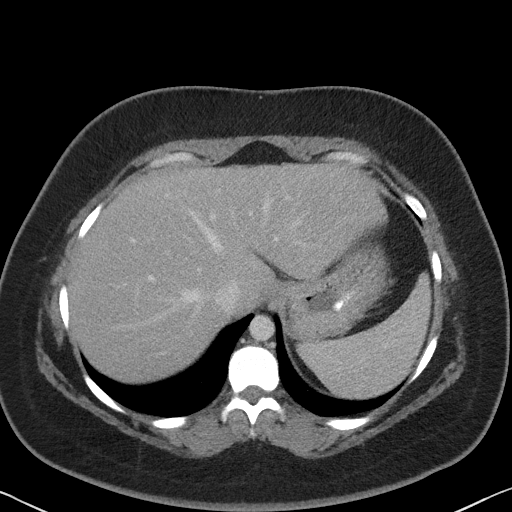
[im 86/90  soft-tissue]
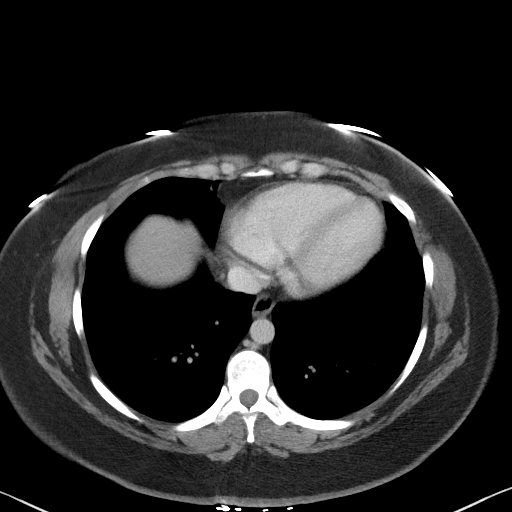

[Series 5: coronal st · coronal · 0.82mm/px · 3 of 103 slices shown]
[im 35/103  soft-tissue]
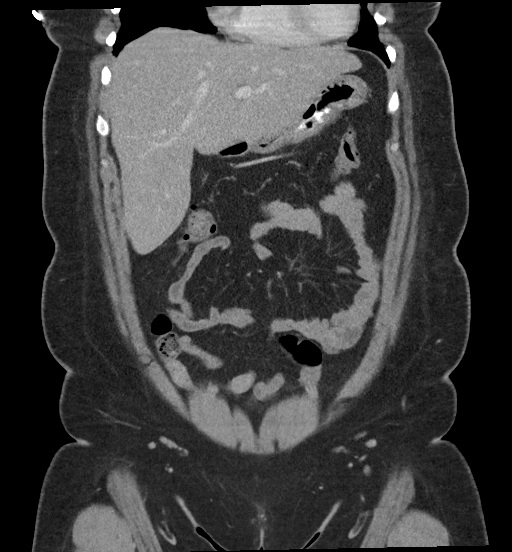
[im 46/103  soft-tissue]
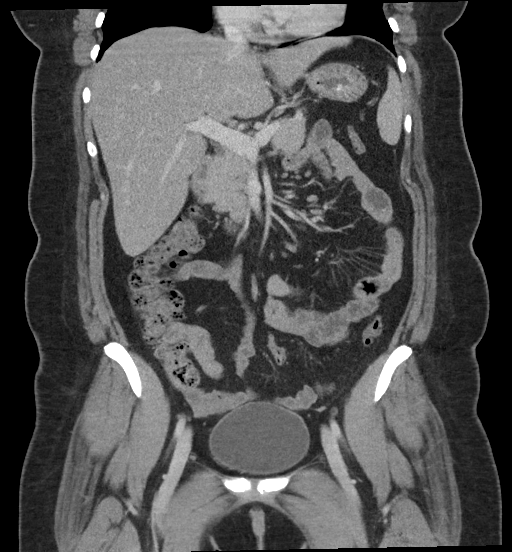
[im 57/103  soft-tissue]
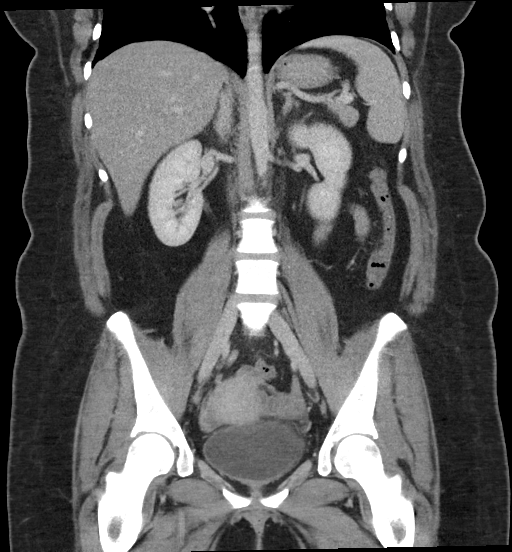

[17 of 46 positions shown; findings below may reference images not displayed]

FINDINGS: Lower chest: Negative.

Hepatobiliary: Chronically absent gallbladder. Mild hepatic
steatosis suspected but otherwise negative liver. No bile duct
enlargement.

Pancreas: Negative.

Spleen: Negative.

Adrenals/Urinary Tract: Normal adrenal glands. Symmetric and normal
renal contrast enhancement. No nephrolithiasis is evident. Both
ureters appear normal to the bladder. There is a tiny chronic and
benign appearing right renal midpole cortical cyst or
angiomyolipoma. Diminutive, unremarkable urinary bladder.

Stomach/Bowel: Negative large bowel with normal appendix (series 2,
image 61). Negative terminal ileum. No dilated small bowel. Trace
oral contrast in the stomach and duodenum. No free air, free fluid,
mesenteric stranding.

Vascular/Lymphatic: Major arterial structures are patent with no
atherosclerosis. Portal venous system is patent. No lymphadenopathy.

Reproductive: Stable from the ultrasound [REDACTED], similar CT
appearance to that in 4546.

Other: No pelvic free fluid.

Musculoskeletal: Negative.
IMPRESSION: 1. No acute or inflammatory process identified in the abdomen.
Pelvis appears stable from [REDACTED] ultrasound (please see that
report).
2. Mild hepatic steatosis suspected.

## 2021-11-16 ENCOUNTER — Ambulatory Visit (INDEPENDENT_AMBULATORY_CARE_PROVIDER_SITE_OTHER): Payer: Self-pay | Admitting: Licensed Practical Nurse

## 2021-11-16 VITALS — BP 104/63 | HR 76 | Ht 62.0 in | Wt 195.0 lb

## 2021-11-16 DIAGNOSIS — E282 Polycystic ovarian syndrome: Secondary | ICD-10-CM

## 2021-11-16 NOTE — Progress Notes (Signed)
Obstetrics & Gynecology Office Visit   Chief Complaint:  Chief Complaint  Patient presents with   Follow-up    History of Present Illness: Was seen on 9/21 at Urgent care for lower abdominal pain. She had [place] that suggested PCOS, her PCP started her on Metformin and told her to follow up with a OB GYN.  She has never been told prior to this that she has PCOS.  She does have irregular cycles, she could a month or two without a cycle, her flow tends to be light-sometimes only spotting. She has been trying  to lose weight as she desire to conceive. She exercises a day. She has had difficulty losing weight.  Denies excessive facial hair or changes to her voice.   LMP September 2, She thinks she has not ovulated since.But she has noticed a change in her discharge since starting the Metformin.      Review of Systems: irregular cycles, watery discharge.   Past Medical History:  Past Medical History:  Diagnosis Date   Anemia    Ovarian cyst     Past Surgical History:  Past Surgical History:  Procedure Laterality Date   CHOLECYSTECTOMY  01/2012   [facility]    Gynecologic History: Patient's last menstrual period was 10/13/2021.  Obstetric History: G0P0000  Family History:  Family History  Problem Relation Age of Onset   Diabetes Maternal Grandfather        TYPE 2    Social History:  Social History   Socioeconomic History   Marital status: Single    Spouse name: Not on file   Number of children: 0   Years of education: 14   Highest education level: Not on file  Occupational History   Not on file  Tobacco Use   Smoking status: Never   Smokeless tobacco: Never  Vaping Use   Vaping Use: Never used  Substance and Sexual Activity   Alcohol use: No   Drug use: Never   Sexual activity: Yes  Other Topics Concern   Not on file  Social History Narrative   Not on file   Social Determinants of Health   Financial Resource Strain: Not on file  Food Insecurity: Not on  file  Transportation Needs: Not on file  Physical Activity: Not on file  Stress: Not on file  Social Connections: Not on file  Intimate Partner Violence: Not on file    Allergies:  Allergies  Allergen Reactions   Other Itching    Pistachios = severe swelling, walnuts, bananas, apples = itching    Medications: Prior to Admission medications   Medication Sig Start Date End Date Taking? Authorizing Provider  metFORMIN (GLUCOPHAGE) 500 MG tablet Take 500 mg by mouth 1 day or 1 dose.   Yes [provider]  Diethylpropion HCl 25 MG TABS Take 1 tablet (25 mg total) by mouth daily before breakfast. Patient not taking: Reported on 05/04/2021 06/02/20   Nadara Mustard, MD  metroNIDAZOLE (FLAGYL) 500 MG tablet Take 1 tablet (500 mg total) by mouth 2 (two) times daily. Patient not taking: Reported on 11/16/2021 05/08/21   Ellwood Sayers, CNM  norethindrone-ethinyl estradiol (JUNEL FE 1/20) 1-20 MG-MCG tablet Take 1 tablet by mouth daily. Patient not taking: Reported on 05/04/2021 02/22/20   Copland, Ilona Sorrel, PA-C  phentermine (ADIPEX-P) 37.5 MG tablet One tablet po in morning. Patient not taking: Reported on 05/04/2021 03/27/20   Nadara Mustard, MD  topiramate (TOPAMAX) 25 MG  tablet TAKE 1 TABLET BY MOUTH TWICE A DAY Patient not taking: Reported on 05/04/2021 06/02/20   Gae Dry, MD  ferrous sulfate 325 (65 FE) MG tablet Take 325 mg by mouth daily with breakfast. Patient not taking: Reported on 12/24/2019  12/24/19  [provider]    Physical Exam Vitals:  Vitals:   11/16/21 1545  BP: 104/63  Pulse: 76   Patient's last menstrual period was 10/13/2021.  General: NAD Neurologic: Grossly intact Psychiatric: mood appropriate, affect full  Female chaperone present for pelvic  portions of the physical exam  Assessment: 31 y.o. G0P0000 PCOS   Plan: Problem List Items Addressed This Visit   None Visit Diagnoses     PCOS (polycystic ovarian syndrome)    -   Primary   Relevant Orders   TSH+Prl+FSH+TestT+LH+DHEA S...       Will do labs to confirm PCOS  Continue Metformin as this may help you conceive  TTC: start PNV and Folic acid daily, continue to try to lose weight, at least 10 percent. Recommend returning to clinic if unable to conceive   Roberto Scales, [place], [facility]  11/21/21  6:15 PM

## 2021-11-21 DIAGNOSIS — E282 Polycystic ovarian syndrome: Secondary | ICD-10-CM | POA: Insufficient documentation

## 2021-11-23 LAB — TSH+PRL+FSH+TESTT+LH+DHEA S...
17-Hydroxyprogesterone: 148 ng/dL
Androstenedione: 138 ng/dL (ref 41–262)
DHEA-SO4: 138 ug/dL (ref 84.8–378.0)
FSH: 7.4 m[IU]/mL
LH: 31.8 m[IU]/mL
Prolactin: 27.9 ng/mL — ABNORMAL HIGH (ref 4.8–23.3)
TSH: 2.58 u[IU]/mL (ref 0.450–4.500)
Testosterone, Free: 1.2 pg/mL (ref 0.0–4.2)
Testosterone: 30 ng/dL (ref 13–71)

## 2022-02-12 ENCOUNTER — Encounter: Payer: Self-pay | Admitting: Licensed Practical Nurse

## 2022-02-12 ENCOUNTER — Other Ambulatory Visit: Payer: Commercial Managed Care - HMO

## 2022-02-12 ENCOUNTER — Telehealth: Payer: Self-pay

## 2022-02-12 DIAGNOSIS — O200 Threatened abortion: Secondary | ICD-10-CM

## 2022-02-12 NOTE — Telephone Encounter (Signed)
Pt called reporting several positive pregnancy test, she has a pregnancy conformation on Thursday, Pt states she started spotting and its not like a light period. Beta orders put in pt will come in for labs.

## 2022-02-13 LAB — BETA HCG QUANT (REF LAB): hCG Quant: 2 m[IU]/mL

## 2022-02-13 NOTE — Progress Notes (Signed)
TC to Neda: Your hormone level is 2, you probably had a chemical pregnancy. Reviewed chemical pregnancy. Tina Gutierrez has a pregnancy confirmation visit on Thursday, she will cancel and fu as needed.  All questions answered Roberto Scales, [place], [facility]  02/13/22  8:56 AM

## 2022-02-14 ENCOUNTER — Ambulatory Visit: Payer: Medicaid Other

## 2022-04-19 ENCOUNTER — Encounter: Payer: Self-pay | Admitting: Obstetrics

## 2022-04-19 ENCOUNTER — Ambulatory Visit (INDEPENDENT_AMBULATORY_CARE_PROVIDER_SITE_OTHER): Payer: Medicaid Other

## 2022-04-19 VITALS — BP 126/78 | HR 81 | Resp 15 | Ht 61.0 in | Wt 189.6 lb

## 2022-04-19 DIAGNOSIS — Z3201 Encounter for pregnancy test, result positive: Secondary | ICD-10-CM | POA: Diagnosis not present

## 2022-04-19 DIAGNOSIS — N912 Amenorrhea, unspecified: Secondary | ICD-10-CM

## 2022-04-19 LAB — POCT URINE PREGNANCY: Preg Test, Ur: POSITIVE — AB

## 2022-04-19 NOTE — Progress Notes (Addendum)
    NURSE VISIT NOTE  Subjective:    Patient ID: Tina Gutierrez, female    DOB: [birth year], 32 y.o.   MRN: [id number]  HPI  Patient is a 32 y.o. G0P0000 female who presents for evaluation of amenorrhea. She believes she could be pregnant. Pregnancy is desired. Sexual Activity: has sex with males. Current symptoms also include: breast tenderness, fatigue, frequent urination, morning sickness, nausea, and positive home pregnancy test. Last period was abnormal, patient reports that LMP was 02/11/2022  but states that in February she had spotted for 3 days .    Objective:    BP 126/78   Pulse 81   Resp 15   Ht 5\' 1"  (1.549 m)   Wt 189 lb 9.6 oz (86 kg)   LMP 02/11/2022 (Exact Date)   BMI 35.82 kg/m   Lab Review  Results for orders placed or performed in visit on 04/19/22  POCT urine pregnancy  Result Value Ref Range   Preg Test, Ur Positive (A) Negative    Assessment:   1. Absence of menstruation     Plan:   Pregnancy Test: Positive  Estimated Date of Delivery: 11/18/2022 Encouraged well-balanced diet, plenty of rest when needed, pre-natal vitamins daily and walking for exercise.  Discussed self-help for nausea, avoiding OTC medications until consulting provider or pharmacist, other than Tylenol as needed, minimal caffeine (1-2 cups daily) and avoiding alcohol.   She will schedule her nurse visit @ [redacted] wks pregnant, u/s for dating and labs @10  wk, and NOB visit at [redacted] wk pregnant.        Minette Headland, CMA

## 2022-04-19 NOTE — Patient Instructions (Signed)
CONGRATULATIONS BABY ON BOARD!!    First Trimester of Pregnancy  The first trimester of pregnancy starts on the first day of your last menstrual period until the end of week 12. This is also called months 1 through 3 of pregnancy. Body changes during your first trimester Your body goes through many changes during pregnancy. The changes usually return to normal after your baby is born. Physical changes You may gain or lose weight. Your breasts may grow larger and hurt. The area around your nipples may get darker. Dark spots or blotches may develop on your face. You may have changes in your hair. Health changes You may feel like you might vomit (nauseous), and you may vomit. You may have heartburn. You may have headaches. You may have trouble pooping (constipation). Your gums may bleed. Other changes You may get tired easily. You may pee (urinate) more often. Your menstrual periods will stop. You may not feel hungry. You may want to eat certain kinds of food. You may have changes in your emotions from day to day. You may have more dreams. Follow these instructions at home: Medicines Take over-the-counter and prescription medicines only as told by your doctor. Some medicines are not safe during pregnancy. Take a prenatal vitamin that contains at least 600 micrograms (mcg) of folic acid. Eating and drinking Eat healthy meals that include: Fresh fruits and vegetables. Whole grains. Good sources of protein, such as meat, eggs, or tofu. Low-fat dairy products. Avoid raw meat and unpasteurized juice, milk, and cheese. If you feel like you may vomit, or you vomit: Eat 4 or 5 small meals a day instead of 3 large meals. Try eating a few soda crackers. Drink liquids between meals instead of during meals. You may need to take these actions to prevent or treat trouble pooping: Drink enough fluids to keep your pee (urine) pale yellow. Eat foods that are high in fiber. These include  beans, whole grains, and fresh fruits and vegetables. Limit foods that are high in fat and sugar. These include fried or sweet foods. Activity Exercise only as told by your doctor. Most people can do their usual exercise routine during pregnancy. Stop exercising if you have cramps or pain in your lower belly (abdomen) or low back. Do not exercise if it is too hot or too humid, or if you are in a place of great height (high altitude). Avoid heavy lifting. If you choose to, you may have sex unless your doctor tells you not to. Relieving pain and discomfort Wear a good support bra if your breasts are sore. Rest with your legs raised (elevated) if you have leg cramps or low back pain. If you have bulging veins (varicose veins) in your legs: Wear support hose as told by your doctor. Raise your feet for 15 minutes, 3-4 times a day. Limit salt in your food. Safety Wear your seat belt at all times when you are in a car. Talk with your doctor if someone is hurting you or yelling at you. Talk with your doctor if you are feeling sad or have thoughts of hurting yourself. Lifestyle Do not use hot tubs, steam rooms, or saunas. Do not douche. Do not use tampons or scented sanitary pads. Do not use herbal medicines, illegal drugs, or medicines that are not approved by your doctor. Do not drink alcohol. Do not smoke or use any products that contain nicotine or tobacco. If you need help quitting, ask your doctor. Avoid cat litter boxes and soil  that is used by cats. These carry germs that can cause harm to the baby and can cause a loss of your baby by miscarriage or stillbirth. General instructions Keep all follow-up visits. This is important. Ask for help if you need counseling or if you need help with nutrition. Your doctor can give you advice or tell you where to go for help. Visit your dentist. At home, brush your teeth with a soft toothbrush. Floss gently. Write down your questions. Take them to your  prenatal visits. Where to find more information American Pregnancy Association: americanpregnancy.org [employer]: www.acog.org Office on Women's Health: [employer]Port[employer].br Contact a doctor if: You are dizzy. You have a fever. You have mild cramps or pressure in your lower belly. You have a nagging pain in your belly area. You continue to feel like you may vomit, you vomit, or you have watery poop (diarrhea) for 24 hours or longer. You have a bad-smelling fluid coming from your vagina. You have pain when you pee. You are exposed to a disease that spreads from person to person, such as chickenpox, measles, Zika virus, HIV, or hepatitis. Get help right away if: You have spotting or bleeding from your vagina. You have very bad belly cramping or pain. You have shortness of breath or chest pain. You have any kind of injury, such as from a fall or a car crash. You have new or increased pain, swelling, or redness in an arm or leg. Summary The first trimester of pregnancy starts on the first day of your last menstrual period until the end of week 12 (months 1 through 3). Eat 4 or 5 small meals a day instead of 3 large meals. Do not smoke or use any products that contain nicotine or tobacco. If you need help quitting, ask your doctor. Keep all follow-up visits. This information is not intended to replace advice given to you by your health care provider. Make sure you discuss any questions you have with your health care provider. Document Revised: 07/07/2019 Document Reviewed: 05/13/2019 Elsevier Patient Education  [place].

## 2022-04-22 ENCOUNTER — Ambulatory Visit (INDEPENDENT_AMBULATORY_CARE_PROVIDER_SITE_OTHER): Payer: Medicaid Other

## 2022-04-22 VITALS — Wt 189.0 lb

## 2022-04-22 DIAGNOSIS — Z130 Encounter for screening for diseases of the blood and blood-forming organs and certain disorders involving the immune mechanism: Secondary | ICD-10-CM

## 2022-04-22 DIAGNOSIS — Z3480 Encounter for supervision of other normal pregnancy, unspecified trimester: Secondary | ICD-10-CM | POA: Insufficient documentation

## 2022-04-22 DIAGNOSIS — Z369 Encounter for antenatal screening, unspecified: Secondary | ICD-10-CM

## 2022-04-22 DIAGNOSIS — Z3689 Encounter for other specified antenatal screening: Secondary | ICD-10-CM

## 2022-04-22 NOTE — Patient Instructions (Signed)
First Trimester of Pregnancy  The first trimester of pregnancy starts on the first day of your last menstrual period until the end of week 12. This is also called months 1 through 3 of pregnancy. Body changes during your first trimester Your body goes through many changes during pregnancy. The changes usually return to normal after your baby is born. Physical changes You may gain or lose weight. Your breasts may grow larger and hurt. The area around your nipples may get darker. Dark spots or blotches may develop on your face. You may have changes in your hair. Health changes You may feel like you might vomit (nauseous), and you may vomit. You may have heartburn. You may have headaches. You may have trouble pooping (constipation). Your gums may bleed. Other changes You may get tired easily. You may pee (urinate) more often. Your menstrual periods will stop. You may not feel hungry. You may want to eat certain kinds of food. You may have changes in your emotions from day to day. You may have more dreams. Follow these instructions at home: Medicines Take over-the-counter and prescription medicines only as told by your doctor. Some medicines are not safe during pregnancy. Take a prenatal vitamin that contains at least 600 micrograms (mcg) of folic acid. Eating and drinking Eat healthy meals that include: Fresh fruits and vegetables. Whole grains. Good sources of protein, such as meat, eggs, or tofu. Low-fat dairy products. Avoid raw meat and unpasteurized juice, milk, and cheese. If you feel like you may vomit, or you vomit: Eat 4 or 5 small meals a day instead of 3 large meals. Try eating a few soda crackers. Drink liquids between meals instead of during meals. You may need to take these actions to prevent or treat trouble pooping: Drink enough fluids to keep your pee (urine) pale yellow. Eat foods that are high in fiber. These include beans, whole grains, and fresh fruits and  vegetables. Limit foods that are high in fat and sugar. These include fried or sweet foods. Activity Exercise only as told by your doctor. Most people can do their usual exercise routine during pregnancy. Stop exercising if you have cramps or pain in your lower belly (abdomen) or low back. Do not exercise if it is too hot or too humid, or if you are in a place of great height (high altitude). Avoid heavy lifting. If you choose to, you may have sex unless your doctor tells you not to. Relieving pain and discomfort Wear a good support bra if your breasts are sore. Rest with your legs raised (elevated) if you have leg cramps or low back pain. If you have bulging veins (varicose veins) in your legs: Wear support hose as told by your doctor. Raise your feet for 15 minutes, 3-4 times a day. Limit salt in your food. Safety Wear your seat belt at all times when you are in a car. Talk with your doctor if someone is hurting you or yelling at you. Talk with your doctor if you are feeling sad or have thoughts of hurting yourself. Lifestyle Do not use hot tubs, steam rooms, or saunas. Do not douche. Do not use tampons or scented sanitary pads. Do not use herbal medicines, illegal drugs, or medicines that are not approved by your doctor. Do not drink alcohol. Do not smoke or use any products that contain nicotine or tobacco. If you need help quitting, ask your doctor. Avoid cat litter boxes and soil that is used by cats. These carry   germs that can cause harm to the baby and can cause a loss of your baby by miscarriage or stillbirth. General instructions Keep all follow-up visits. This is important. Ask for help if you need counseling or if you need help with nutrition. Your doctor can give you advice or tell you where to go for help. Visit your dentist. At home, brush your teeth with a soft toothbrush. Floss gently. Write down your questions. Take them to your prenatal visits. Where to find more  information American Pregnancy Association: americanpregnancy.org American College of Obstetricians and Gynecologists: www.acog.org Office on Women's Health: womenshealth.gov/pregnancy Contact a doctor if: You are dizzy. You have a fever. You have mild cramps or pressure in your lower belly. You have a nagging pain in your belly area. You continue to feel like you may vomit, you vomit, or you have watery poop (diarrhea) for 24 hours or longer. You have a bad-smelling fluid coming from your vagina. You have pain when you pee. You are exposed to a disease that spreads from person to person, such as chickenpox, measles, Zika virus, HIV, or hepatitis. Get help right away if: You have spotting or bleeding from your vagina. You have very bad belly cramping or pain. You have shortness of breath or chest pain. You have any kind of injury, such as from a fall or a car crash. You have new or increased pain, swelling, or redness in an arm or leg. Summary The first trimester of pregnancy starts on the first day of your last menstrual period until the end of week 12 (months 1 through 3). Eat 4 or 5 small meals a day instead of 3 large meals. Do not smoke or use any products that contain nicotine or tobacco. If you need help quitting, ask your doctor. Keep all follow-up visits. This information is not intended to replace advice given to you by your health care provider. Make sure you discuss any questions you have with your health care provider. Document Revised: 07/07/2019 Document Reviewed: 05/13/2019 Elsevier Patient Education  2023 Elsevier Inc. Commonly Asked Questions During Pregnancy  Cats: A parasite can be excreted in cat feces.  To avoid exposure you need to have another person empty the little box.  If you must empty the litter box you will need to wear gloves.  Wash your hands after handling your cat.  This parasite can also be found in raw or undercooked meat so this should also be  avoided.  Colds, Sore Throats, Flu: Please check your medication sheet to see what you can take for symptoms.  If your symptoms are unrelieved by these medications please call the office.  Dental Work: Most any dental work your dentist recommends is permitted.  X-rays should only be taken during the first trimester if absolutely necessary.  Your abdomen should be shielded with a lead apron during all x-rays.  Please notify your provider prior to receiving any x-rays.  Novocaine is fine; gas is not recommended.  If your dentist requires a note from us prior to dental work please call the office and we will provide one for you.  Exercise: Exercise is an important part of staying healthy during your pregnancy.  You may continue most exercises you were accustomed to prior to pregnancy.  Later in your pregnancy you will most likely notice you have difficulty with activities requiring balance like riding a bicycle.  It is important that you listen to your body and avoid activities that put you at a higher   risk of falling.  Adequate rest and staying well hydrated are a must!  If you have questions about the safety of specific activities ask your provider.    Exposure to Children with illness: Try to avoid obvious exposure; report any symptoms to us when noted,  If you have chicken pos, red measles or mumps, you should be immune to these diseases.   Please do not take any vaccines while pregnant unless you have checked with your OB provider.  Fetal Movement: After 28 weeks we recommend you do "kick counts" twice daily.  Lie or sit down in a calm quiet environment and count your baby movements "kicks".  You should feel your baby at least 10 times per hour.  If you have not felt 10 kicks within the first hour get up, walk around and have something sweet to eat or drink then repeat for an additional hour.  If count remains less than 10 per hour notify your provider.  Fumigating: Follow your pest control agent's  advice as to how long to stay out of your home.  Ventilate the area well before re-entering.  Hemorrhoids:   Most over-the-counter preparations can be used during pregnancy.  Check your medication to see what is safe to use.  It is important to use a stool softener or fiber in your diet and to drink lots of liquids.  If hemorrhoids seem to be getting worse please call the office.   Hot Tubs:  Hot tubs Jacuzzis and saunas are not recommended while pregnant.  These increase your internal body temperature and should be avoided.  Intercourse:  Sexual intercourse is safe during pregnancy as long as you are comfortable, unless otherwise advised by your provider.  Spotting may occur after intercourse; report any bright red bleeding that is heavier than spotting.  Labor:  If you know that you are in labor, please go to the hospital.  If you are unsure, please call the office and let us help you decide what to do.  Lifting, straining, etc:  If your job requires heavy lifting or straining please check with your provider for any limitations.  Generally, you should not lift items heavier than that you can lift simply with your hands and arms (no back muscles)  Painting:  Paint fumes do not harm your pregnancy, but may make you ill and should be avoided if possible.  Latex or water based paints have less odor than oils.  Use adequate ventilation while painting.  Permanents & Hair Color:  Chemicals in hair dyes are not recommended as they cause increase hair dryness which can increase hair loss during pregnancy.  " Highlighting" and permanents are allowed.  Dye may be absorbed differently and permanents may not hold as well during pregnancy.  Sunbathing:  Use a sunscreen, as skin burns easily during pregnancy.  Drink plenty of fluids; avoid over heating.  Tanning Beds:  Because their possible side effects are still unknown, tanning beds are not recommended.  Ultrasound Scans:  Routine ultrasounds are performed  at approximately 20 weeks.  You will be able to see your baby's general anatomy an if you would like to know the gender this can usually be determined as well.  If it is questionable when you conceived you may also receive an ultrasound early in your pregnancy for dating purposes.  Otherwise ultrasound exams are not routinely performed unless there is a medical necessity.  Although you can request a scan we ask that you pay for it when   conducted because insurance does not cover " patient request" scans.  Work: If your pregnancy proceeds without complications you may work until your due date, unless your physician or employer advises otherwise.  Round Ligament Pain/Pelvic Discomfort:  Sharp, shooting pains not associated with bleeding are fairly common, usually occurring in the second trimester of pregnancy.  They tend to be worse when standing up or when you remain standing for long periods of time.  These are the result of pressure of certain pelvic ligaments called "round ligaments".  Rest, Tylenol and heat seem to be the most effective relief.  As the womb and fetus grow, they rise out of the pelvis and the discomfort improves.  Please notify the office if your pain seems different than that described.  It may represent a more serious condition.  Common Medications Safe in Pregnancy  Acne:      Constipation:  Benzoyl Peroxide     Colace  Clindamycin      Dulcolax Suppository  Topica Erythromycin     Fibercon  Salicylic Acid      Metamucil         Miralax AVOID:        Senakot   Accutane    Cough:  Retin-A       Cough Drops  Tetracycline      Phenergan w/ Codeine if Rx  Minocycline      Robitussin (Plain & DM)  Antibiotics:     Crabs/Lice:  Ceclor       RID  Cephalosporins    AVOID:  E-Mycins      Kwell  Keflex  Macrobid/Macrodantin   Diarrhea:  Penicillin      Kao-Pectate  Zithromax      Imodium AD         PUSH FLUIDS AVOID:       Cipro     Fever:  Tetracycline      Tylenol (Regular  or Extra  Minocycline       Strength)  Levaquin      Extra Strength-Do not          Exceed 8 tabs/24 hrs Caffeine:        <200mg/day (equiv. To 1 cup of coffee or  approx. 3 12 oz sodas)         Gas: Cold/Hayfever:       Gas-X  Benadryl      Mylicon  Claritin       Phazyme  **Claritin-D        Chlor-Trimeton    Headaches:  Dimetapp      ASA-Free Excedrin  Drixoral-Non-Drowsy     Cold Compress  Mucinex (Guaifenasin)     Tylenol (Regular or Extra  Sudafed/Sudafed-12 Hour     Strength)  **Sudafed PE Pseudoephedrine   Tylenol Cold & Sinus     Vicks Vapor Rub  Zyrtec  **AVOID if Problems With Blood Pressure         Heartburn: Avoid lying down for at least 1 hour after meals  Aciphex      Maalox     Rash:  Milk of Magnesia     Benadryl    Mylanta       1% Hydrocortisone Cream  Pepcid  Pepcid Complete   Sleep Aids:  Prevacid      Ambien   Prilosec       Benadryl  Rolaids       Chamomile Tea  Tums (Limit 4/day)     Unisom           Tylenol PM         Warm milk-add vanilla or  Hemorrhoids:       Sugar for taste  Anusol/Anusol H.C.  (RX: Analapram 2.5%)  Sugar Substitutes:  Hydrocortisone OTC     Ok in moderation  Preparation H      Tucks        Vaseline lotion applied to tissue with wiping    Herpes:     Throat:  Acyclovir      Oragel  Famvir  Valtrex     Vaccines:         Flu Shot Leg Cramps:       *Gardasil  Benadryl      Hepatitis A         Hepatitis B Nasal Spray:       Pneumovax  Saline Nasal Spray     Polio Booster         Tetanus Nausea:       Tuberculosis test or PPD  Vitamin B6 25 mg TID   AVOID:    Dramamine      *Gardasil  Emetrol       Live Poliovirus  Ginger Root 250 mg QID    MMR (measles, mumps &  High Complex Carbs @ Bedtime    rebella)  Sea Bands-Accupressure    Varicella (Chickenpox)  Unisom 1/2 tab TID     *No known complications           If received before Pain:         Known pregnancy;   Darvocet       Resume series  after  Lortab        Delivery  Percocet    Yeast:   Tramadol      Femstat  Tylenol 3      Gyne-lotrimin  Ultram       Monistat  Vicodin           MISC:         All Sunscreens           Hair Coloring/highlights          Insect Repellant's          (Including DEET)         Mystic Tans  

## 2022-04-22 NOTE — Progress Notes (Signed)
New OB Intake  I connected with  Tina Gutierrez on 04/22/22 at  1:15 PM EDT by telephone and verified that I am speaking with the correct person using two identifiers. 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  I explained I am completing New OB Intake today. We discussed her EDD of 11/18/2022 that is based on LMP of 02/11/2022. Pt is G3/P0020. I reviewed her allergies, medications, Medical/Surgical/OB history, and appropriate screenings. Pt states she has stopped all her medications.  There are cats in the home no. Based on history, this is a/an pregnancy uncomplicated .   Patient Active Problem List   Diagnosis Date Noted   Supervision of other normal pregnancy, antepartum 04/22/2022   PCOS (polycystic ovarian syndrome) 11/21/2021   Menometrorrhagia 07/05/2019   Symptomatic anemia    Abnormal uterine bleeding 06/27/2017    Concerns addressed today Pt has had some spotting that is brown; adv brown meant it is old; if spots pink to bright red and has had IC in the last 24-48hrs it's probably from that; if bleeds like a period she needs to be seen.   Delivery Plans:  Plans to deliver at [facility].  Anatomy US Explained first scheduled [place] will be scheduled as soon as she called centralized scheduling - numbeer given.  An anatomy scan will be done at 20 weeks.  Labs Discussed genetic screening with patient. Patient desires genetic testing to be drawn with new OB labs. Discussed possible labs to be drawn at new OB appointment.  COVID Vaccine Patient has had COVID vaccine.   Social Determinants of Health Food Insecurity: denies food insecurity Transportation: Patient denies transportation needs.  First visit review I reviewed new OB appt with pt. I explained she will have ob bloodwork and pap smear/pelvic exam if indicated. Explained pt will be seen by Gigi Gin, CNM at first visit; encounter routed to appropriate provider.    Cleophas Dunker, [place] 04/22/2022  1:47 PM

## 2022-04-23 ENCOUNTER — Telehealth: Payer: Self-pay | Admitting: Licensed Practical Nurse

## 2022-04-23 ENCOUNTER — Other Ambulatory Visit: Payer: Self-pay | Admitting: Licensed Practical Nurse

## 2022-04-23 ENCOUNTER — Other Ambulatory Visit: Payer: Medicaid Other

## 2022-04-23 ENCOUNTER — Telehealth: Payer: Self-pay

## 2022-04-23 ENCOUNTER — Encounter: Payer: Self-pay | Admitting: Licensed Practical Nurse

## 2022-04-23 ENCOUNTER — Ambulatory Visit
Admission: RE | Admit: 2022-04-23 | Discharge: 2022-04-23 | Disposition: A | Discharge status: Home / Self Care | Payer: Medicaid Other | Source: Ambulatory Visit | Attending: Obstetrics | Admitting: Obstetrics

## 2022-04-23 DIAGNOSIS — Z369 Encounter for antenatal screening, unspecified: Secondary | ICD-10-CM | POA: Insufficient documentation

## 2022-04-23 DIAGNOSIS — O200 Threatened abortion: Secondary | ICD-10-CM

## 2022-04-23 DIAGNOSIS — Z3A01 Less than 8 weeks gestation of pregnancy: Secondary | ICD-10-CM | POA: Insufficient documentation

## 2022-04-23 DIAGNOSIS — Z3480 Encounter for supervision of other normal pregnancy, unspecified trimester: Secondary | ICD-10-CM | POA: Diagnosis present

## 2022-04-23 NOTE — Telephone Encounter (Signed)
Pt  thought to be 10wks , had [place] today was told that she was measuring 6wks, a heartbeat was not visible. Was told it was too early to get a heartbeat. She was advised to have serial Beta's done.  Tina Gutierrez has had some spotting that is  light brown or pink, no bright red bleeding.  [place] report not available at this time to review. Tina Gutierrez understanding of the situation. Will go to [facility] after work and repeat in [place]. She has an apt at [facility] on Friday.  Bleeding precautions reviewed.  Labs ordered Tina Gutierrez, [facility]  04/23/22 2:30 PM

## 2022-04-23 NOTE — Telephone Encounter (Signed)
Patient called triage, stating she had her ultrasound, she's spotting and the blood is getting darker and darker, at her ultrasound the tech told her she couldn't find the heart beat.

## 2022-04-24 ENCOUNTER — Other Ambulatory Visit: Payer: Self-pay | Admitting: Obstetrics

## 2022-04-24 DIAGNOSIS — Z3490 Encounter for supervision of normal pregnancy, unspecified, unspecified trimester: Secondary | ICD-10-CM

## 2022-04-24 LAB — BETA HCG QUANT (REF LAB): hCG Quant: 8578 m[IU]/mL

## 2022-04-24 NOTE — Progress Notes (Addendum)
I have ordered another early dating scan for her to be done in 2 weeks.  Imagene Riches, CNM  04/24/2022 11:36 AM

## 2022-04-25 ENCOUNTER — Encounter: Payer: Self-pay | Admitting: Obstetrics & Gynecology

## 2022-04-25 ENCOUNTER — Other Ambulatory Visit: Payer: Medicaid Other

## 2022-04-26 ENCOUNTER — Other Ambulatory Visit: Payer: Medicaid Other

## 2022-04-26 LAB — BETA HCG QUANT (REF LAB): hCG Quant: 13396 m[IU]/mL

## 2022-05-03 ENCOUNTER — Ambulatory Visit
Admission: RE | Admit: 2022-05-03 | Discharge: 2022-05-03 | Disposition: A | Discharge status: Home / Self Care | Payer: Medicaid Other | Source: Ambulatory Visit | Attending: Obstetrics | Admitting: Obstetrics

## 2022-05-03 DIAGNOSIS — Z3A01 Less than 8 weeks gestation of pregnancy: Secondary | ICD-10-CM | POA: Insufficient documentation

## 2022-05-03 DIAGNOSIS — Z3491 Encounter for supervision of normal pregnancy, unspecified, first trimester: Secondary | ICD-10-CM | POA: Diagnosis not present

## 2022-05-03 DIAGNOSIS — Z3490 Encounter for supervision of normal pregnancy, unspecified, unspecified trimester: Secondary | ICD-10-CM | POA: Diagnosis present

## 2022-05-05 ENCOUNTER — Encounter: Payer: Self-pay | Admitting: Obstetrics

## 2022-05-07 ENCOUNTER — Emergency Department: Payer: Medicaid Other

## 2022-05-07 ENCOUNTER — Emergency Department
Admission: EM | Admit: 2022-05-07 | Discharge: 2022-05-07 | Disposition: A | Discharge status: Home / Self Care | Payer: Medicaid Other | Attending: Emergency Medicine | Admitting: Emergency Medicine

## 2022-05-07 ENCOUNTER — Encounter: Payer: Self-pay | Admitting: Emergency Medicine

## 2022-05-07 ENCOUNTER — Encounter: Payer: Medicaid Other | Admitting: Obstetrics

## 2022-05-07 DIAGNOSIS — O418X10 Other specified disorders of amniotic fluid and membranes, first trimester, not applicable or unspecified: Secondary | ICD-10-CM | POA: Diagnosis not present

## 2022-05-07 DIAGNOSIS — Z3A01 Less than 8 weeks gestation of pregnancy: Secondary | ICD-10-CM | POA: Diagnosis not present

## 2022-05-07 DIAGNOSIS — O200 Threatened abortion: Secondary | ICD-10-CM

## 2022-05-07 DIAGNOSIS — O209 Hemorrhage in early pregnancy, unspecified: Secondary | ICD-10-CM | POA: Diagnosis present

## 2022-05-07 DIAGNOSIS — IMO0001 Reserved for inherently not codable concepts without codable children: Secondary | ICD-10-CM

## 2022-05-07 LAB — ABO/RH: ABO/RH(D): A POS

## 2022-05-07 LAB — CBC WITH DIFFERENTIAL/PLATELET
Abs Immature Granulocytes: 0.03 10*3/uL (ref 0.00–0.07)
Basophils Absolute: 0 10*3/uL (ref 0.0–0.1)
Basophils Relative: 0 %
Eosinophils Absolute: 0.2 10*3/uL (ref 0.0–0.5)
Eosinophils Relative: 2 %
HCT: 38.2 % (ref 36.0–46.0)
Hemoglobin: 12.5 g/dL (ref 12.0–15.0)
Immature Granulocytes: 0 %
Lymphocytes Relative: 31 %
Lymphs Abs: 3 10*3/uL (ref 0.7–4.0)
MCH: 25.2 pg — ABNORMAL LOW (ref 26.0–34.0)
MCHC: 32.7 g/dL (ref 30.0–36.0)
MCV: 76.9 fL — ABNORMAL LOW (ref 80.0–100.0)
Monocytes Absolute: 0.5 10*3/uL (ref 0.1–1.0)
Monocytes Relative: 6 %
Neutro Abs: 5.7 10*3/uL (ref 1.7–7.7)
Neutrophils Relative %: 61 %
Platelets: 343 10*3/uL (ref 150–400)
RBC: 4.97 MIL/uL (ref 3.87–5.11)
RDW: 15.7 % — ABNORMAL HIGH (ref 11.5–15.5)
WBC: 9.4 10*3/uL (ref 4.0–10.5)
nRBC: 0 % (ref 0.0–0.2)

## 2022-05-07 LAB — COMPREHENSIVE METABOLIC PANEL
ALT: 24 U/L (ref 0–44)
AST: 18 U/L (ref 15–41)
Albumin: 3.7 g/dL (ref 3.5–5.0)
Alkaline Phosphatase: 45 U/L (ref 38–126)
Anion gap: 13 (ref 5–15)
BUN: 10 mg/dL (ref 6–20)
CO2: 20 mmol/L — ABNORMAL LOW (ref 22–32)
Calcium: 9.2 mg/dL (ref 8.9–10.3)
Chloride: 103 mmol/L (ref 98–111)
Creatinine, Ser: 0.43 mg/dL — ABNORMAL LOW (ref 0.44–1.00)
GFR, Estimated: >60 mL/min (ref >60)
Glucose, Bld: 117 mg/dL — ABNORMAL HIGH (ref 70–99)
Potassium: 3.5 mmol/L (ref 3.5–5.1)
Sodium: 136 mmol/L (ref 135–145)
Total Bilirubin: 0.4 mg/dL (ref 0.3–1.2)
Total Protein: 7.1 g/dL (ref 6.5–8.1)

## 2022-05-07 LAB — URINALYSIS, ROUTINE W REFLEX MICROSCOPIC
Bilirubin Urine: NEGATIVE
Glucose, UA: NEGATIVE mg/dL
Ketones, ur: NEGATIVE mg/dL
Leukocytes,Ua: NEGATIVE
Nitrite: NEGATIVE
Protein, ur: NEGATIVE mg/dL
RBC / HPF: >50 RBC/hpf (ref 0–5)
Specific Gravity, Urine: 1.005 (ref 1.005–1.030)
pH: 6 (ref 5.0–8.0)

## 2022-05-07 LAB — HCG, QUANTITATIVE, PREGNANCY: hCG, Beta Chain, Quant, S: 57618 m[IU]/mL — ABNORMAL HIGH (ref <5)

## 2022-05-07 NOTE — Discharge Instructions (Addendum)
Follow-up with your OB/GYN practice within the next week.  Return to the ER for new, worsening, or persistent severe bleeding, abdominal pain, weakness or lightheadedness, fever, or any other new or worsening symptoms that concern you.

## 2022-05-07 NOTE — ED Triage Notes (Signed)
Pt presents ambulatory to triage via POV with complaints of vaginal bleeding that started today. Pt is ~ [redacted] weeks pregnant; was seen at [facility] and had passed a large clot. She was initially at the [facility] for frequent urination and burning.  A&Ox4 at this time. Denies CP or SOB.

## 2022-05-07 NOTE — ED Notes (Signed)
Pt to US.

## 2022-05-07 NOTE — ED Provider Notes (Signed)
[facility] Provider Note    Event Date/Time   First MD Initiated Contact with Patient 05/07/22 2010     (approximate)   History   Vaginal Bleeding   HPI  Tina Gutierrez is a 32 y.o. female G3P0 at approximately 8 weeks who presents with vaginal bleeding, acute onset this evening.  The patient states that she had urinary frequency throughout the day today but no dysuria, bleeding, or abdominal pain.  She went to urgent care and then started having bleeding and passed a few clots.  She still is not having any abdominal pain.  She denies any weakness or lightheadedness.  She does report some spotting earlier in the pregnancy but had ultrasounds that were reassuring.  I reviewed the past medical records.  The patient was most recently seen by [facility] on 3/11 for intake.  Ultrasound on 3/22 showed a 7-week IUP.   Physical Exam   Triage Vital Signs: ED Triage Vitals  Enc Vitals Group     BP 05/07/22 2003 131/81     Pulse Rate 05/07/22 2003 87     Resp 05/07/22 2003 18     Temp 05/07/22 2003 98 F (36.7 C)     Temp src --      SpO2 05/07/22 2003 100 %     Weight 05/07/22 2002 186 lb (84.4 kg)     Height 05/07/22 2002 5\' 1"  (1.549 m)     Head Circumference --      Peak Flow --      Pain Score 05/07/22 2031 0     Pain Loc --      Pain Edu? --      Excl. in [place]? --     Most recent vital signs: Vitals:   05/07/22 2124 05/07/22 2200  BP: (!) 115/49 (!) 116/56  Pulse:  79  Resp:  16  Temp:  98 F (36.7 C)  SpO2:  97%     General: Awake, no distress.  CV:  Good peripheral perfusion.  Resp:  Normal effort.  Abd:  Soft and nontender.  No distention.  Other:  Small amount of blood in the vaginal vault with no pooling or active bleeding.  No tenderness.  Cervical os closed.   ED Results / Procedures / Treatments   Labs (all labs ordered are listed, but only abnormal results are displayed) Labs Reviewed  CBC WITH DIFFERENTIAL/PLATELET -  Abnormal; Notable for the following components:      Result Value   MCV 76.9 (*)    MCH 25.2 (*)    RDW 15.7 (*)    All other components within normal limits  COMPREHENSIVE METABOLIC PANEL - Abnormal; Notable for the following components:   CO2 20 (*)    Glucose, Bld 117 (*)    Creatinine, Ser 0.43 (*)    All other components within normal limits  HCG, QUANTITATIVE, PREGNANCY - Abnormal; Notable for the following components:   hCG, Beta Chain, Quant, S 57,618 (*)    All other components within normal limits  URINALYSIS, ROUTINE W REFLEX MICROSCOPIC - Abnormal; Notable for the following components:   Color, Urine STRAW (*)    APPearance CLEAR (*)    Hgb urine dipstick LARGE (*)    Bacteria, UA RARE (*)    All other components within normal limits  ABO/RH     EKG     RADIOLOGY  US OB: I independently viewed and interpreted the images; there is an  IUP with FHR and a subchorionic hemorrhage.  PROCEDURES:  Critical Care performed: No  Procedures   MEDICATIONS ORDERED IN ED: Medications - No data to display   IMPRESSION / MDM / [place] / ED COURSE  I reviewed the triage vital signs and the nursing notes.  32 year old female G3P0 at approximately 8 weeks presents with vaginal bleeding but no acute abdominal pain.  On exam the patient is well-appearing, vital signs are normal, the abdomen is soft and nontender, and vaginal exam reveals a small amount of blood but the cervical os is closed.  Differential diagnosis includes, but is not limited to, threatened miscarriage, incomplete miscarriage, subchorionic hemorrhage.  The patient already has a confirmed IUP so I do not suspect ectopic or heterotopic pregnancy.  We will obtain lab workup, ultrasound, and reassess.  Patient's presentation is most consistent with acute complicated illness / injury requiring diagnostic workup.  ----------------------------------------- 10:00 PM on  05/07/2022 -----------------------------------------  Ultrasound confirms an IUP with a small subchorionic hemorrhage.  There are no other concerning acute findings.  Hemoglobin is normal.  Urinalysis is negative for acute findings.  There is no evidence of UTI.  hCG is 57,000.  The patient is stable for discharge home.  I counseled her on the results of the workup.  She will follow-up with [facility].  I gave her strict return precautions and she expresses understanding.   FINAL CLINICAL IMPRESSION(S) / ED DIAGNOSES   Final diagnoses:  Threatened miscarriage  Subchorionic hemorrhage of placenta in first trimester, single or unspecified fetus     Rx / DC Orders   ED Discharge Orders     None        Note:  This document was prepared using Dragon voice recognition software and may include unintentional dictation errors.    Arta Silence, MD 05/07/22 2220

## 2022-05-07 NOTE — ED Notes (Signed)
Pt reports a "pulling type of pressure" in her lower abdomen and has c/o frequent urination. Pt was at urgent care for possible UTI, and as she was checking in she "felt like she was peeing on herself" and asked to use the restroom. Pt stated that "the staff noticed a trail of blood on the floor, and stated that she "made a mess on the floor" and that the toilet bowl was red. Pt stated that she was told she needed to come to the ER once the staff was told she was pregnant. Pt then went home to change and passed a large clot in the toilet but wasn't able to tell how large it was because of the amt of blood in the toilet. Pt states she has not been in any pain.

## 2022-05-22 ENCOUNTER — Telehealth: Payer: Self-pay

## 2022-05-22 NOTE — Telephone Encounter (Signed)
Pt called triage and said she was at the ER on 05/07/22 and had an U/S and it showed a subchorionic hemorrhage, was advised to call our office for a f/u appt. She missed our called thinking it was the hospital. She called today and was advised by front desk nothing available to offer. She denies vaginal bleeding, has had mild cramping. Is she ok to wait for her NOB visit on 4/26? She's aware to go to ER if heavy bleeding or severe cramping develops.

## 2022-05-28 ENCOUNTER — Other Ambulatory Visit (HOSPITAL_COMMUNITY)
Admission: RE | Admit: 2022-05-28 | Discharge: 2022-05-28 | Disposition: A | Discharge status: Home / Self Care | Payer: Medicaid Other | Source: Ambulatory Visit | Attending: Licensed Practical Nurse | Admitting: Licensed Practical Nurse

## 2022-05-28 ENCOUNTER — Other Ambulatory Visit: Payer: Medicaid Other

## 2022-05-28 ENCOUNTER — Encounter: Payer: Self-pay | Admitting: Licensed Practical Nurse

## 2022-05-28 DIAGNOSIS — Z3480 Encounter for supervision of other normal pregnancy, unspecified trimester: Secondary | ICD-10-CM | POA: Diagnosis not present

## 2022-05-28 DIAGNOSIS — Z369 Encounter for antenatal screening, unspecified: Secondary | ICD-10-CM | POA: Diagnosis present

## 2022-05-28 DIAGNOSIS — Z130 Encounter for screening for diseases of the blood and blood-forming organs and certain disorders involving the immune mechanism: Secondary | ICD-10-CM

## 2022-05-29 LAB — URINALYSIS, ROUTINE W REFLEX MICROSCOPIC
Bilirubin, UA: NEGATIVE
Glucose, UA: NEGATIVE
Ketones, UA: NEGATIVE
Leukocytes,UA: NEGATIVE
Nitrite, UA: NEGATIVE
Protein,UA: NEGATIVE
RBC, UA: NEGATIVE
Specific Gravity, UA: 1.012 (ref 1.005–1.030)
Urobilinogen, Ur: 0.2 mg/dL (ref 0.2–1.0)
pH, UA: 7 (ref 5.0–7.5)

## 2022-05-30 LAB — URINE CULTURE, OB REFLEX

## 2022-05-30 LAB — CULTURE, OB URINE

## 2022-05-31 LAB — CBC/D/PLT+RPR+RH+ABO+RUBIGG...
Antibody Screen: NEGATIVE
Basophils Absolute: 0 10*3/uL (ref 0.0–0.2)
Basos: 0 %
EOS (ABSOLUTE): 0.3 10*3/uL (ref 0.0–0.4)
Eos: 3 %
HCV Ab: NONREACTIVE
HIV Screen 4th Generation wRfx: NONREACTIVE
Hematocrit: 40.7 % (ref 34.0–46.6)
Hemoglobin: 12.9 g/dL (ref 11.1–15.9)
Hepatitis B Surface Ag: NEGATIVE
Immature Grans (Abs): 0 10*3/uL (ref 0.0–0.1)
Immature Granulocytes: 0 %
Lymphocytes Absolute: 1.9 10*3/uL (ref 0.7–3.1)
Lymphs: 26 %
MCH: 25.1 pg — ABNORMAL LOW (ref 26.6–33.0)
MCHC: 31.7 g/dL (ref 31.5–35.7)
MCV: 79 fL (ref 79–97)
Monocytes Absolute: 0.4 10*3/uL (ref 0.1–0.9)
Monocytes: 6 %
Neutrophils Absolute: 4.8 10*3/uL (ref 1.4–7.0)
Neutrophils: 65 %
Platelets: 310 10*3/uL (ref 150–450)
RBC: 5.13 x10E6/uL (ref 3.77–5.28)
RDW: 16.6 % — ABNORMAL HIGH (ref 11.7–15.4)
RPR Ser Ql: NONREACTIVE
Rh Factor: POSITIVE
Rubella Antibodies, IGG: 2.56 index (ref >0.99)
Varicella zoster IgG: 415 index (ref >165)
WBC: 7.4 10*3/uL (ref 3.4–10.8)

## 2022-05-31 LAB — HCV INTERPRETATION

## 2022-05-31 LAB — HGB FRACTIONATION CASCADE
Hgb A2: 2.5 % (ref 1.8–3.2)
Hgb A: 97.5 % (ref 96.4–98.8)
Hgb F: 0 % (ref 0.0–2.0)
Hgb S: 0 %

## 2022-06-02 LAB — MATERNIT 21 PLUS CORE, BLOOD
Fetal Fraction: 4
Result (T21): NEGATIVE
Trisomy 13 (Patau syndrome): NEGATIVE
Trisomy 18 (Edwards syndrome): NEGATIVE
Trisomy 21 (Down syndrome): NEGATIVE

## 2022-06-04 LAB — URINE CYTOLOGY ANCILLARY ONLY
Chlamydia: NEGATIVE
Comment: NEGATIVE
Comment: NORMAL
Neisseria Gonorrhea: NEGATIVE

## 2022-06-05 LAB — MONITOR DRUG PROFILE 14(MW)

## 2022-06-05 LAB — NICOTINE SCREEN, URINE

## 2022-06-07 ENCOUNTER — Other Ambulatory Visit (HOSPITAL_COMMUNITY)
Admission: RE | Admit: 2022-06-07 | Discharge: 2022-06-07 | Disposition: A | Discharge status: Home / Self Care | Payer: Medicaid Other | Source: Ambulatory Visit | Attending: Obstetrics | Admitting: Obstetrics

## 2022-06-07 ENCOUNTER — Encounter: Payer: Self-pay | Admitting: Licensed Practical Nurse

## 2022-06-07 ENCOUNTER — Ambulatory Visit (INDEPENDENT_AMBULATORY_CARE_PROVIDER_SITE_OTHER): Payer: Medicaid Other | Admitting: Licensed Practical Nurse

## 2022-06-07 VITALS — BP 113/76 | HR 86 | Wt 193.7 lb

## 2022-06-07 DIAGNOSIS — Z0283 Encounter for blood-alcohol and blood-drug test: Secondary | ICD-10-CM

## 2022-06-07 DIAGNOSIS — Z3A12 12 weeks gestation of pregnancy: Secondary | ICD-10-CM | POA: Diagnosis not present

## 2022-06-07 DIAGNOSIS — J45909 Unspecified asthma, uncomplicated: Secondary | ICD-10-CM | POA: Insufficient documentation

## 2022-06-07 DIAGNOSIS — R87610 Atypical squamous cells of undetermined significance on cytologic smear of cervix (ASC-US): Secondary | ICD-10-CM | POA: Insufficient documentation

## 2022-06-07 DIAGNOSIS — E669 Obesity, unspecified: Secondary | ICD-10-CM | POA: Insufficient documentation

## 2022-06-07 DIAGNOSIS — Z3481 Encounter for supervision of other normal pregnancy, first trimester: Secondary | ICD-10-CM | POA: Diagnosis not present

## 2022-06-07 DIAGNOSIS — Z124 Encounter for screening for malignant neoplasm of cervix: Secondary | ICD-10-CM | POA: Diagnosis not present

## 2022-06-07 DIAGNOSIS — Z3480 Encounter for supervision of other normal pregnancy, unspecified trimester: Secondary | ICD-10-CM | POA: Insufficient documentation

## 2022-06-07 MED ORDER — ASPIRIN 81 MG PO CHEW: 81.0000 mg | CHEWABLE_TABLET | Freq: Every day | ORAL | Status: DC
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
## 2022-06-08 LAB — NICOTINE SCREEN, URINE: Cotinine Ql Scrn, Ur: NEGATIVE ng/mL

## 2022-06-08 LAB — URINE DRUG PANEL 7
Amphetamines, Urine: NEGATIVE ng/mL
Barbiturate Quant, Ur: NEGATIVE ng/mL
Benzodiazepine Quant, Ur: NEGATIVE ng/mL
Cannabinoid Quant, Ur: NEGATIVE ng/mL
Cocaine (Metab.): NEGATIVE ng/mL
Opiate Quant, Ur: NEGATIVE ng/mL
PCP Quant, Ur: NEGATIVE ng/mL

## 2022-06-17 LAB — CYTOLOGY - PAP
Comment: NEGATIVE
Diagnosis: HIGH — AB
High risk HPV: NEGATIVE

## 2022-06-18 ENCOUNTER — Other Ambulatory Visit: Payer: Self-pay | Admitting: Licensed Practical Nurse

## 2022-06-18 DIAGNOSIS — O219 Vomiting of pregnancy, unspecified: Secondary | ICD-10-CM

## 2022-06-18 MED ORDER — ONDANSETRON HCL 4 MG PO TABS: 4.0000 mg | ORAL_TABLET | Freq: Every day | ORAL | 1 refills | Status: DC | PRN

## 2022-06-18 NOTE — Progress Notes (Signed)
TC to [name], Reviewed pap results, rec colpo PP.  Tina Gutierrez mentioned that she has had significant nausea and would like Zofran as we previously discussed at her apt. Script for Zofran sent.  Carie Caddy, CNM    [facility]  06/18/22  2:36 PM

## 2022-07-05 ENCOUNTER — Ambulatory Visit: Payer: Medicaid Other

## 2022-07-05 ENCOUNTER — Other Ambulatory Visit: Payer: Medicaid Other

## 2022-07-05 ENCOUNTER — Ambulatory Visit (INDEPENDENT_AMBULATORY_CARE_PROVIDER_SITE_OTHER): Payer: Medicaid Other | Admitting: Certified Nurse Midwife

## 2022-07-05 ENCOUNTER — Encounter: Payer: Medicaid Other | Admitting: Certified Nurse Midwife

## 2022-07-05 VITALS — BP 112/69 | HR 92 | Wt 190.5 lb

## 2022-07-05 DIAGNOSIS — Z131 Encounter for screening for diabetes mellitus: Secondary | ICD-10-CM

## 2022-07-05 DIAGNOSIS — O26899 Other specified pregnancy related conditions, unspecified trimester: Secondary | ICD-10-CM

## 2022-07-05 DIAGNOSIS — E282 Polycystic ovarian syndrome: Secondary | ICD-10-CM

## 2022-07-05 DIAGNOSIS — Z3480 Encounter for supervision of other normal pregnancy, unspecified trimester: Secondary | ICD-10-CM

## 2022-07-05 DIAGNOSIS — Z369 Encounter for antenatal screening, unspecified: Secondary | ICD-10-CM

## 2022-07-05 DIAGNOSIS — Z3A16 16 weeks gestation of pregnancy: Secondary | ICD-10-CM

## 2022-07-05 DIAGNOSIS — Z1379 Encounter for other screening for genetic and chromosomal anomalies: Secondary | ICD-10-CM

## 2022-07-05 DIAGNOSIS — O99282 Endocrine, nutritional and metabolic diseases complicating pregnancy, second trimester: Secondary | ICD-10-CM

## 2022-07-05 LAB — POCT URINALYSIS DIPSTICK
Bilirubin, UA: NEGATIVE
Blood, UA: NEGATIVE
Glucose, UA: NEGATIVE
Ketones, UA: NEGATIVE
Leukocytes, UA: NEGATIVE
Nitrite, UA: NEGATIVE
Protein, UA: NEGATIVE
Spec Grav, UA: 1.015 (ref 1.010–1.025)
Urobilinogen, UA: 0.2 E.U./dL
pH, UA: 6.5 (ref 5.0–8.0)

## 2022-07-05 NOTE — Progress Notes (Addendum)
   PRENATAL VISIT NOTE  Subjective:  Tina Gutierrez is a 32 y.o. G3P0020 at [redacted]w[redacted]d being seen today for ongoing prenatal care.  She is currently monitored for the following issues for this low-risk pregnancy and has Abnormal uterine bleeding; Menometrorrhagia; Symptomatic anemia; PCOS (polycystic ovarian syndrome); Supervision of other normal pregnancy, antepartum; Asthma; and Obesity (BMI 35.0-39.9 without comorbidity) on their problem list.  Patient reports  3 episodes of short of breath with facial tingling. Hx of asthma, used inhaler with minimal relief of symptoms. Denies chest pain, resolved with rest all three times .  Contractions: Not present. Vag. Bleeding: None.  Movement: Absent. Denies leaking of fluid.   The following portions of the patient's history were reviewed and updated as appropriate: allergies, current medications, past family history, past medical history, past social history, past surgical history and problem list.   Objective:   Vitals:   07/05/22 0828  BP: 112/69  Pulse: 92  Weight: 190 lb 8 oz (86.4 kg)   Total weight gain: 7 lb 8 oz (3.402 kg) Fetal Status: Fetal Heart Rate (bpm): 150   Movement: Absent      General:  Alert, oriented and cooperative. Patient is in no acute distress.  Skin: Skin is warm and dry. No rash noted.   Cardiovascular: Normal heart rate noted. Regular rate & rhythm, no murmur  Respiratory: Normal respiratory effort, no problems with respiration noted. Lumgs CTAB anterior & posterior without adventitious sounds  Abdomen: Soft, gravid, appropriate for gestational age.  Pain/Pressure: Present     Pelvic: Cervical exam deferred        Extremities: Normal range of motion.     Mental Status: Normal mood and affect. Normal behavior. Normal judgment and thought content.   Assessment and Plan:  Pregnancy: G3P0020 at [redacted]w[redacted]d 1. Screening for diabetes mellitus - Hemoglobin A1c - POCT Urinalysis Dipstick  2. Encounter for genetic  screening  - AFP, Serum, Open Spina Bifida  3. Supervision of other normal pregnancy, antepartum - CBC for shortness of breath at times - Hemoglobin A1c - AFP, Serum, Open Spina Bifida  4. PCOS (polycystic ovarian syndrome)  - Hemoglobin A1c  5. [redacted] weeks gestation of pregnancy  - POCT Urinalysis Dipstick  6. Antenatal screening encounter   Preterm labor symptoms and general obstetric precautions including but not limited to vaginal bleeding, contractions, leaking of fluid and fetal movement were reviewed in detail with the patient. Please refer to After Visit Summary for other counseling recommendations.   Recommend ED evaluation with worsening shortness of breath, chest pain or no resolution of symptoms, however at this time suspect physiologic changes of pregnancy.  Return in 4 weeks (on 08/02/2022) for ROB.  Future Appointments  Date Time Provider Department Center  07/05/2022 11:15 AM Dominica Severin, CNM [facility] None  08/02/2022 10:15 AM [facility] [place] 1 AOB-[facility] None  08/02/2022  1:35 PM Hildred Laser, MD [facility] None    Dominica Severin, CNM

## 2022-07-05 NOTE — Patient Instructions (Signed)

## 2022-07-07 LAB — CBC
Hematocrit: 37.2 % (ref 34.0–46.6)
Hemoglobin: 12.1 g/dL (ref 11.1–15.9)
MCH: 26 pg — ABNORMAL LOW (ref 26.6–33.0)
MCHC: 32.5 g/dL (ref 31.5–35.7)
MCV: 80 fL (ref 79–97)
Platelets: 326 10*3/uL (ref 150–450)
RBC: 4.66 x10E6/uL (ref 3.77–5.28)
RDW: 15 % (ref 11.7–15.4)
WBC: 8.7 10*3/uL (ref 3.4–10.8)

## 2022-07-07 LAB — AFP, SERUM, OPEN SPINA BIFIDA
AFP MoM: 0.59
AFP Value: 17.5 ng/mL
Gest. Age on Collection Date: 16.1 weeks
Maternal Age At EDD: 30.2 yr
OSBR Risk 1 IN: 10000
Test Results:: NEGATIVE
Weight: 190 [lb_av]

## 2022-07-07 LAB — HEMOGLOBIN A1C
Est. average glucose Bld gHb Est-mCnc: 103 mg/dL
Hgb A1c MFr Bld: 5.2 % (ref 4.8–5.6)

## 2022-08-02 ENCOUNTER — Ambulatory Visit (INDEPENDENT_AMBULATORY_CARE_PROVIDER_SITE_OTHER): Payer: Medicaid Other | Admitting: Obstetrics and Gynecology

## 2022-08-02 ENCOUNTER — Encounter: Payer: Self-pay | Admitting: Obstetrics and Gynecology

## 2022-08-02 ENCOUNTER — Ambulatory Visit (INDEPENDENT_AMBULATORY_CARE_PROVIDER_SITE_OTHER): Payer: Medicaid Other

## 2022-08-02 VITALS — BP 99/64 | HR 85 | Wt 200.4 lb

## 2022-08-02 DIAGNOSIS — Z3689 Encounter for other specified antenatal screening: Secondary | ICD-10-CM

## 2022-08-02 DIAGNOSIS — Z3482 Encounter for supervision of other normal pregnancy, second trimester: Secondary | ICD-10-CM

## 2022-08-02 DIAGNOSIS — Z3A20 20 weeks gestation of pregnancy: Secondary | ICD-10-CM

## 2022-08-02 DIAGNOSIS — Z362 Encounter for other antenatal screening follow-up: Secondary | ICD-10-CM

## 2022-08-02 DIAGNOSIS — Z3A19 19 weeks gestation of pregnancy: Secondary | ICD-10-CM

## 2022-08-02 DIAGNOSIS — Z131 Encounter for screening for diabetes mellitus: Secondary | ICD-10-CM

## 2022-08-02 DIAGNOSIS — O09292 Supervision of pregnancy with other poor reproductive or obstetric history, second trimester: Secondary | ICD-10-CM

## 2022-08-02 LAB — POCT URINALYSIS DIPSTICK OB
Bilirubin, UA: NEGATIVE
Blood, UA: NEGATIVE
Glucose, UA: NEGATIVE
Ketones, UA: NEGATIVE
Leukocytes, UA: NEGATIVE
Nitrite, UA: NEGATIVE
POC,PROTEIN,UA: NEGATIVE
Spec Grav, UA: 1.015 (ref 1.010–1.025)
Urobilinogen, UA: 0.2 E.U./dL
pH, UA: 6 (ref 5.0–8.0)

## 2022-08-02 NOTE — Progress Notes (Addendum)
ROB: Patient is a 32 y.o. G3P0020 at [redacted]w[redacted]d who presents for routine OB care.  Pregnancy is complicated by History of anemia, PCOS (polycystic ovarian syndrome); Asthma; and Obesity (BMI 35.0-39.9 without comorbidity) on their problem list.. Patient has complaints of back pain, is intermittent. Also sometimes right hip pain. Discussed comfort measures (pregnancy girdle, Tylenol, warm baths and compresses, occasional Icy Hot).  For AFP and HgA1c for obesity.  Reviewed ultrasound, incomplete anatomy noted and AFI subjectively low.  Advised on increasing hydration (currently drinking ~ 4-5 bottles per day, to increase to 6-8 bottles per day). Will repeat anatomy scan and f/u in 2-3 weeks to complete anatomy and reassess fluid. Initial BP with elevated diastolic today but repeat normal. Continue to monitor.

## 2022-08-02 NOTE — Progress Notes (Signed)
ROB [redacted]w[redacted]d: She is doing well. She reports good fetal movement. She has some back pain that is constant

## 2022-08-04 LAB — HEMOGLOBIN A1C
Est. average glucose Bld gHb Est-mCnc: 100 mg/dL
Hgb A1c MFr Bld: 5.1 % (ref 4.8–5.6)

## 2022-08-04 LAB — AFP, SERUM, OPEN SPINA BIFIDA
AFP MoM: 0.57
AFP Value: 28.2 ng/mL
Gest. Age on Collection Date: 20.1 weeks
Maternal Age At EDD: 30.2 yr
OSBR Risk 1 IN: 10000
Test Results:: NEGATIVE
Weight: 200 [lb_av]

## 2022-08-16 ENCOUNTER — Ambulatory Visit
Admission: RE | Admit: 2022-08-16 | Discharge: 2022-08-16 | Disposition: A | Discharge status: Home / Self Care | Payer: Medicaid Other | Source: Ambulatory Visit | Attending: Obstetrics and Gynecology | Admitting: Obstetrics and Gynecology

## 2022-08-16 DIAGNOSIS — Z362 Encounter for other antenatal screening follow-up: Secondary | ICD-10-CM | POA: Diagnosis present

## 2022-09-02 ENCOUNTER — Encounter: Payer: Self-pay | Admitting: Obstetrics

## 2022-09-02 ENCOUNTER — Ambulatory Visit (INDEPENDENT_AMBULATORY_CARE_PROVIDER_SITE_OTHER): Payer: Medicaid Other | Admitting: Obstetrics

## 2022-09-02 VITALS — BP 110/72 | HR 87 | Wt 200.3 lb

## 2022-09-02 DIAGNOSIS — Z3480 Encounter for supervision of other normal pregnancy, unspecified trimester: Secondary | ICD-10-CM

## 2022-09-02 DIAGNOSIS — M5432 Sciatica, left side: Secondary | ICD-10-CM

## 2022-09-02 LAB — POCT URINALYSIS DIPSTICK OB
Bilirubin, UA: NEGATIVE
Blood, UA: NEGATIVE
Glucose, UA: NEGATIVE
Ketones, UA: NEGATIVE
Leukocytes, UA: NEGATIVE
Nitrite, UA: NEGATIVE
POC,PROTEIN,UA: NEGATIVE
Spec Grav, UA: 1.015 (ref 1.010–1.025)
Urobilinogen, UA: 0.2 E.U./dL
pH, UA: 6 (ref 5.0–8.0)

## 2022-09-02 NOTE — Addendum Note (Signed)
Addended by: Loney Laurence on: 09/02/2022 02:38 PM   Modules accepted: Orders

## 2022-09-02 NOTE — Assessment & Plan Note (Signed)
-  Reviewed s/s of PTL, kick counts, and normal discomforts of pregnancy -Reviewed comfort measures for sciatica. Referral placed to chiropractor -Encouraged CBE. Link sent via MyChart -Discussed 28-week labs and preparation for 1-hour glucose

## 2022-09-02 NOTE — Progress Notes (Signed)
    Return Prenatal Note   Assessment/Plan   Plan  32 y.o. G3P0020 at [redacted]w[redacted]d presents for follow-up OB visit. Reviewed prenatal record including previous visit note.  Supervision of other normal pregnancy, antepartum -Reviewed s/s of PTL, kick counts, and normal discomforts of pregnancy -Reviewed comfort measures for sciatica. Referral placed to chiropractor -Encouraged CBE. Link sent via MyChart -Discussed 28-week labs and preparation for 1-hour glucose    Orders Placed This Encounter  Procedures   Ambulatory referral to Chiropractic    Referral Priority:   Routine    Referral Type:   Chiropractic    Referral Reason:   Specialty Services Required    Requested Specialty:   Chiropractic Medicine    Number of Visits Requested:   1   Return in about 4 weeks (around 09/30/2022).   Future Appointments  Date Time Provider Department Center  09/30/2022  9:00 AM [facility] LAB [facility] None  09/30/2022  9:35 AM Dominic, Courtney Heys, CNM [facility] None    For next visit:  ROB with 1 hour gluocla, third trimester labs, and Tdap     Subjective   31 y.o. G3P0020 at [redacted]w[redacted]d presents for this follow-up prenatal visit.  Tina Gutierrez is doing well but is having some cramping/BH at night accompanied by back pain. This usually resolves on its own. She does report back pain with pain that shoots down her leg. This occurs upon waking or after standing for extended periods of time.  She is interested in CBE. Patient reports: Movement: Present Contractions: Irritability  Objective   Flow sheet Vitals: Pulse Rate: 87 BP: 110/72 Fundal Height: 25 cm Fetal Heart Rate (bpm): 146 Total weight gain: 17 lb 4.8 oz (7.847 kg)  General Appearance  No acute distress, well appearing, and well nourished Pulmonary   Normal work of breathing Neurologic   Alert and oriented to person, place, and time Psychiatric   Mood and affect within normal limits  Glenetta Borg, CNM  09/01/2408:49 AM

## 2022-09-26 ENCOUNTER — Other Ambulatory Visit: Payer: Self-pay

## 2022-09-26 DIAGNOSIS — Z113 Encounter for screening for infections with a predominantly sexual mode of transmission: Secondary | ICD-10-CM

## 2022-09-26 DIAGNOSIS — Z131 Encounter for screening for diabetes mellitus: Secondary | ICD-10-CM

## 2022-09-26 DIAGNOSIS — D649 Anemia, unspecified: Secondary | ICD-10-CM

## 2022-09-30 ENCOUNTER — Encounter: Payer: Self-pay | Admitting: Licensed Practical Nurse

## 2022-09-30 ENCOUNTER — Ambulatory Visit (INDEPENDENT_AMBULATORY_CARE_PROVIDER_SITE_OTHER): Payer: Medicaid Other | Admitting: Licensed Practical Nurse

## 2022-09-30 ENCOUNTER — Other Ambulatory Visit: Payer: Medicaid Other

## 2022-09-30 VITALS — BP 130/81 | HR 94 | Wt 204.3 lb

## 2022-09-30 DIAGNOSIS — Z23 Encounter for immunization: Secondary | ICD-10-CM

## 2022-09-30 DIAGNOSIS — Z3483 Encounter for supervision of other normal pregnancy, third trimester: Secondary | ICD-10-CM

## 2022-09-30 DIAGNOSIS — Z3480 Encounter for supervision of other normal pregnancy, unspecified trimester: Secondary | ICD-10-CM

## 2022-09-30 DIAGNOSIS — Z3A28 28 weeks gestation of pregnancy: Secondary | ICD-10-CM

## 2022-09-30 LAB — POCT URINALYSIS DIPSTICK
Bilirubin, UA: NEGATIVE
Blood, UA: NEGATIVE
Glucose, UA: NEGATIVE
Ketones, UA: NEGATIVE
Leukocytes, UA: NEGATIVE
Nitrite, UA: NEGATIVE
Protein, UA: NEGATIVE
Spec Grav, UA: 1.015 (ref 1.010–1.025)
Urobilinogen, UA: 0.2 E.U./dL
pH, UA: 7 (ref 5.0–8.0)

## 2022-09-30 NOTE — Progress Notes (Signed)
Routine Prenatal Care Visit  Subjective  Tina Gutierrez is a 32 y.o. G3P0020 at [redacted]w[redacted]d being seen today for ongoing prenatal care.  She is currently monitored for the following issues for this low-risk pregnancy and has Abnormal uterine bleeding; Menometrorrhagia; PCOS (polycystic ovarian syndrome); Supervision of other normal pregnancy, antepartum; Asthma; and Obesity (BMI 35.0-39.9 without comorbidity) on their problem list.  ----------------------------------------------------------------------------------- Patient reports  swelling, fatigue, labial swelling .   -back pain, has been doing stretches and seeing chiro which is helping -intends to sign up for CBE, had BF class through [facility] -BMI 38 TWG 21lbs, walks 15-20 mins a day, her diet varies based on her hunger. Encouraged to increase physical activity to 30 mins or more a day, will keep a food diary for the next 2 weeks an bring it.   Contractions: Irritability. Vag. Bleeding: None.  Movement: Present. Leaking Fluid denies.  ----------------------------------------------------------------------------------- The following portions of the patient's history were reviewed and updated as appropriate: allergies, current medications, past family history, past medical history, past social history, past surgical history and problem list. Problem list updated.  Objective  Blood pressure 130/81, pulse 94, weight 204 lb 4.8 oz (92.7 kg), last menstrual period 02/11/2022. Pregravid weight 183 lb (83 kg) Total Weight Gain 21 lb 4.8 oz (9.662 kg) Urinalysis: Urine Protein    Urine Glucose    Fetal Status: Fetal Heart Rate (bpm): 153 Fundal Height: 29 cm Movement: Present     General:  Alert, oriented and cooperative. Patient is in no acute distress.  Skin: Skin is warm and dry. No rash noted.   Cardiovascular: Normal heart rate noted  Respiratory: Normal respiratory effort, no problems with respiration noted  Abdomen: Soft, gravid, appropriate for  gestational age. Pain/Pressure: Absent     Pelvic:  Cervical exam deferred        Extremities: Normal range of motion.  Edema: Trace  Mental Status: Normal mood and affect. Normal behavior. Normal judgment and thought content.   Assessment   32 y.o. [id number] at [redacted]w[redacted]d by  12/19/2022, by Ultrasound presenting for routine prenatal visit  Plan   third Problems (from 04/22/22 to present)     Problem Noted Resolved   Obesity (BMI 35.0-39.9 without comorbidity) 06/07/2022 by Ellwood Sayers, CNM No   Supervision of other normal pregnancy, antepartum 04/22/2022 by Loran Senters, CMA No   Overview Addendum 06/07/2022 12:14 PM by Ellwood Sayers, CNM     Clinical Staff Provider  Office Location  [facility] Dating  7wkUS  Language  English Anatomy US    Flu Vaccine  offer Genetic Screen  NIPS: negative, xx  TDaP vaccine   offer Hgb A1C or  GTT Early : Third trimester :   Covid No boosters   LAB RESULTS   Rhogam  A/Positive/-- (04/16 5284)  Blood Type A/Positive/-- (04/16 0835)   Feeding Plan breast Antibody Negative (04/16 0835)  Contraception undecided Rubella 2.56 (04/16 0835)  Circumcision no RPR Non Reactive (04/16 0835)   Pediatrician  [facility] HBsAg Negative (04/16 0835)   Support Person Lynnell Chad HIV Non Reactive (04/16 1324)  Prenatal Classes yes Varicella Immune    GBS  (For PCN allergy, check sensitivities)   BTL Consent  Hep C Non Reactive (04/16 0835)   VBAC Consent  Pap Diagnosis  Date Value Ref Range Status  12/24/2019   Final   - Negative for intraepithelial lesion or malignancy (NILM)      Hgb Electro  CF      SMA                    Preterm labor symptoms and general obstetric precautions including but not limited to vaginal bleeding, contractions, leaking of fluid and fetal movement were reviewed in detail with the patient. Please refer to After Visit Summary for other counseling recommendations.   Return in about 2 weeks  (around 10/14/2022) for ROB.  28 wk labs and TDAP done today   Jannifer Hick  [facility]  09/30/22  10:08 AM

## 2022-10-01 LAB — 28 WEEK RH+PANEL
Basophils Absolute: 0 10*3/uL (ref 0.0–0.2)
Basos: 0 %
EOS (ABSOLUTE): 0.1 10*3/uL (ref 0.0–0.4)
Eos: 1 %
Gestational Diabetes Screen: 128 mg/dL (ref 70–139)
HIV Screen 4th Generation wRfx: NONREACTIVE
Hematocrit: 37.9 % (ref 34.0–46.6)
Hemoglobin: 12.5 g/dL (ref 11.1–15.9)
Immature Grans (Abs): 0.1 10*3/uL (ref 0.0–0.1)
Immature Granulocytes: 1 %
Lymphocytes Absolute: 1.9 10*3/uL (ref 0.7–3.1)
Lymphs: 20 %
MCH: 26.8 pg (ref 26.6–33.0)
MCHC: 33 g/dL (ref 31.5–35.7)
MCV: 81 fL (ref 79–97)
Monocytes Absolute: 0.5 10*3/uL (ref 0.1–0.9)
Monocytes: 6 %
Neutrophils Absolute: 6.8 10*3/uL (ref 1.4–7.0)
Neutrophils: 72 %
Platelets: 372 10*3/uL (ref 150–450)
RBC: 4.66 x10E6/uL (ref 3.77–5.28)
RDW: 13.7 % (ref 11.7–15.4)
RPR Ser Ql: NONREACTIVE
WBC: 9.4 10*3/uL (ref 3.4–10.8)

## 2022-10-10 ENCOUNTER — Encounter: Payer: Self-pay | Admitting: Obstetrics and Gynecology

## 2022-10-10 ENCOUNTER — Other Ambulatory Visit: Payer: Self-pay

## 2022-10-10 ENCOUNTER — Observation Stay
Admission: EM | Admit: 2022-10-10 | Discharge: 2022-10-10 | Disposition: A | Discharge status: Home / Self Care | Payer: Medicaid Other | Attending: Obstetrics and Gynecology | Admitting: Obstetrics and Gynecology

## 2022-10-10 DIAGNOSIS — Z3A30 30 weeks gestation of pregnancy: Secondary | ICD-10-CM | POA: Insufficient documentation

## 2022-10-10 DIAGNOSIS — Z7982 Long term (current) use of aspirin: Secondary | ICD-10-CM | POA: Diagnosis not present

## 2022-10-10 DIAGNOSIS — Z3480 Encounter for supervision of other normal pregnancy, unspecified trimester: Principal | ICD-10-CM

## 2022-10-10 DIAGNOSIS — E669 Obesity, unspecified: Secondary | ICD-10-CM

## 2022-10-10 DIAGNOSIS — O368190 Decreased fetal movements, unspecified trimester, not applicable or unspecified: Secondary | ICD-10-CM | POA: Diagnosis present

## 2022-10-10 DIAGNOSIS — Z79899 Other long term (current) drug therapy: Secondary | ICD-10-CM | POA: Insufficient documentation

## 2022-10-10 DIAGNOSIS — O368130 Decreased fetal movements, third trimester, not applicable or unspecified: Secondary | ICD-10-CM | POA: Diagnosis present

## 2022-10-10 NOTE — Progress Notes (Signed)
Patient discharged home per order. She is stable and ambulatory. After Visit Summary was printed and given to the patient. Discharge education completed with patient and support person including follow up instructions, appointments, and medication list. She received labor and bleeding precautions. Patient able to verbalize understanding. All questions fully answered upon discharge. Patient instructed to return to ED, call 911, or call provider for any changes in condition. Patient discharged home via personal vehicle.

## 2022-10-10 NOTE — Final Progress Note (Signed)
OB/Triage Note  Patient ID: AZEL KELCH MRN: [id number] DOB/AGE: 11-Nov-1990 32 y.o.  Subjective  History of Present Illness: The patient is a 32 y.o. female G3P0020 at [redacted]w[redacted]d who presents for decreased fetal movement. Reports not feeling baby move since yesterday morning. Since arriving on the unit she is now feeling movement. Denies VB, painful contractions, LOF.   Past Medical History:  Diagnosis Date   Anemia    Ovarian cyst    Symptomatic anemia     Past Surgical History:  Procedure Laterality Date   CHOLECYSTECTOMY  01/2012   [facility]    No current facility-administered medications on file prior to encounter.   Current Outpatient Medications on File Prior to Encounter  Medication Sig Dispense Refill   aspirin 81 MG chewable tablet Chew 1 tablet (81 mg total) by mouth daily.     Prenatal Vit-Fe Fumarate-FA (MULTIVITAMIN-PRENATAL) 27-0.8 MG TABS tablet Take 2 tablets by mouth daily at 12 noon.     ondansetron (ZOFRAN) 4 MG tablet Take 1 tablet (4 mg total) by mouth daily as needed for nausea or vomiting. (Patient not taking: Reported on 09/30/2022) 30 tablet 1    Allergies  Allergen Reactions   Other Itching    Pistachios = severe swelling, walnuts, bananas, apples = itching    Social History   Socioeconomic History   Marital status: Significant Other    Spouse name: Not on file   Number of children: 0   Years of education: 14   Highest education level: Not on file  Occupational History   Occupation: Psychologist, 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  Tobacco Use   Smoking status: Never   Smokeless tobacco: Never  Vaping Use   Vaping status: Never Used  Substance and Sexual Activity   Alcohol use: No   Drug use: Never   Sexual activity: Yes    Partners: Male    Birth control/protection: None  Other Topics Concern   Not on file  Social History Narrative   Not on file   Social Determinants of Health   Financial Resource Strain: Low Risk  (04/22/2022)   Overall Financial  Resource Strain (CARDIA)    Difficulty of Paying Living Expenses: Not hard at all  Food Insecurity: No Food Insecurity (04/22/2022)   Hunger Vital Sign    Worried About Running Out of Food in the Last Year: Never true    Ran Out of Food in the Last Year: Never true  Transportation Needs: No Transportation Needs (04/22/2022)   PRAPARE - Administrator, Civil Service (Medical): No    Lack of Transportation (Non-Medical): No  Physical Activity: Insufficiently Active (04/22/2022)   Exercise Vital Sign    Days of Exercise per Week: 3 days    Minutes of Exercise per Session: 20 min  Stress: No Stress Concern Present (04/22/2022)   [employer] of Occupational Health - Occupational Stress Questionnaire    Feeling of Stress : Not at all  Social Connections: Moderately Integrated (04/22/2022)   Social Connection and Isolation Panel [NHANES]    Frequency of Communication with Friends and Family: More than three times a week    Frequency of Social Gatherings with Friends and Family: More than three times a week    Attends Religious Services: More than 4 times per year    Active Member of [employer] or Organizations: No    Attends Banker Meetings: Never    Marital Status: Living with partner  Intimate Partner Violence: Not  At Risk (04/22/2022)   Humiliation, Afraid, Rape, and Kick questionnaire    Fear of Current or Ex-Partner: No    Emotionally Abused: No    Physically Abused: No    Sexually Abused: No    Family History  Problem Relation Age of Onset   Healthy Mother    Healthy Father    Healthy Brother    Healthy Brother    Healthy Maternal Grandmother    Healthy Maternal Grandfather    Diabetes Maternal Grandfather        TYPE 2   Healthy Paternal Grandmother    Alzheimer's disease Paternal Grandfather    Dementia Paternal Grandfather      ROS    Objective  Physical Exam: BP 124/68 (BP Location: Right Arm)   Pulse 94   Temp 98.2 F (36.8 C) (Oral)    Ht 5\' 1"  (1.549 m)   Wt 91.6 kg   LMP 02/11/2022 (Exact Date)   BMI 38.17 kg/m   OBGyn Exam  FHT 145, mod variability, pos accels, no decels Toco none  Significant Findings/ Diagnostic Studies: NA   Hospital Course: The patient was admitted to [facility] Triage for observation. Had some difficulty tracing baby due to baby moving very frequently. Entire strip obtained showed good variability, multiple accels, and no decels.  Assessment: 32 y.o. female G3P0020 at [redacted]w[redacted]d  RNST Decreased fetal movement- resolved  Plan: Discharge home Follow up at next ROB or sooner PRN Teaching: fetal kick counts  Discharge Instructions     Discharge activity:  No Restrictions   Complete by: As directed    Discharge diet:  No restrictions   Complete by: As directed    Discharge instructions   Complete by: As directed    Follow up with your OB provider at your next ROB   LABOR:  When conractions begin, you should start to time them from the beginning of one contraction to the beginning  of the next.  When contractions are 5 - 10 minutes apart or less and have been regular for at least an hour, you should call your health care provider.   Complete by: As directed    No sexual activity restrictions   Complete by: As directed    Notify physician for a general feeling that "something is not right"   Complete by: As directed    Notify physician for bleeding from the vagina   Complete by: As directed    Notify physician for blurring of vision or spots before the eyes   Complete by: As directed    Notify physician for chills or fever   Complete by: As directed    Notify physician for fainting spells, "black outs" or loss of consciousness   Complete by: As directed    Notify physician for increase in vaginal discharge   Complete by: As directed    Notify physician for increase or change in vaginal discharge   Complete by: As directed    Notify physician for intestinal cramps, with or without diarrhea,  sometimes described as "gas pain"   Complete by: As directed    Notify physician for leaking of fluid   Complete by: As directed    Notify physician for leaking of fluid   Complete by: As directed    Notify physician for low, dull backache, unrelieved by heat or Tylenol   Complete by: As directed    Notify physician for menstrual like cramps   Complete by: As directed    Notify  physician for pain or burning when urinating   Complete by: As directed    Notify physician for pelvic pressure   Complete by: As directed    Notify physician for pelvic pressure (sudden increase)   Complete by: As directed    Notify physician for severe or continued nausea or vomiting   Complete by: As directed    Notify physician for sudden gushing of fluid from the vagina (with or without continued leaking)   Complete by: As directed    Notify physician for sudden, constant, or occasional abdominal pain   Complete by: As directed    Notify physician for uterine contractions.  These may be painless and feel like the uterus is tightening or the baby is  "balling up"   Complete by: As directed    Notify physician for vaginal bleeding   Complete by: As directed    Notify physician if baby moving less than usual   Complete by: As directed    PRETERM LABOR:  Includes any of the follwing symptoms that occur between 20 - [redacted] weeks gestation.  If these symptoms are not stopped, preterm labor can result in preterm delivery, placing your baby at risk   Complete by: As directed       Allergies as of 10/10/2022       Reactions   Other Itching   Pistachios = severe swelling, walnuts, bananas, apples = itching        Medication List     TAKE these medications    aspirin 81 MG chewable tablet Chew 1 tablet (81 mg total) by mouth daily.   multivitamin-prenatal 27-0.8 MG Tabs tablet Take 2 tablets by mouth daily at 12 noon.   ondansetron 4 MG tablet Commonly known as: Zofran Take 1 tablet (4 mg total) by  mouth daily as needed for nausea or vomiting.         Total time spent taking care of this patient: 45 minutes  Signed: Raeford Razor CNM, FNP 10/10/2022, 10:22 PM

## 2022-10-10 NOTE — OB Triage Note (Signed)
Tina Gutierrez 32 y.o. is a G3P0020 @30wk0d  presents to Labor & Delivery triage via wheelchair steered by ED staff reporting decreased fetal movement. Patient reports she has not felt her baby move since yesterday and is unsure of what time. She denies signs and symptoms consistent with rupture of membranes or active vaginal bleeding. She denies contractions. External FM and TOCO applied to non-tender abdomen. Initial FHR 155bpm. Vital signs obtained and within normal limits. Patient oriented to care environment including call bell and bed control use. Tonita Cong, CNM notified of patient's arrival.

## 2022-10-15 ENCOUNTER — Ambulatory Visit: Payer: Medicaid Other | Admitting: Obstetrics

## 2022-10-15 VITALS — Wt 204.0 lb

## 2022-10-15 DIAGNOSIS — Z3480 Encounter for supervision of other normal pregnancy, unspecified trimester: Secondary | ICD-10-CM

## 2022-10-15 DIAGNOSIS — Z3483 Encounter for supervision of other normal pregnancy, third trimester: Secondary | ICD-10-CM

## 2022-10-15 DIAGNOSIS — Z3A30 30 weeks gestation of pregnancy: Secondary | ICD-10-CM

## 2022-10-15 NOTE — Assessment & Plan Note (Signed)
Reviewed kick counts and preterm labor warning signs. Instructed to call office or come to hospital with persistent headache, vision changes, regular contractions, leaking of fluid, decreased fetal movement or vaginal bleeding. 

## 2022-10-15 NOTE — Progress Notes (Signed)
    Return Prenatal Note   Assessment/Plan   Plan  32 y.o. G3P0020 at [redacted]w[redacted]d presents for follow-up OB visit. Reviewed prenatal record including previous visit note.  Supervision of other normal pregnancy, antepartum Reviewed kick counts and preterm labor warning signs. Instructed to call office or come to hospital with persistent headache, vision changes, regular contractions, leaking of fluid, decreased fetal movement or vaginal bleeding.   No orders of the defined types were placed in this encounter.  Return in about 2 weeks (around 10/29/2022) for ROB.   No future appointments.  For next visit:  continue with routine prenatal care     Subjective   32 y.o. [id number] at [redacted]w[redacted]d presents for this follow-up prenatal visit.  Patient No concerns. Was seen in triage for decreased fetal movement but had reassuring testing at that time.  Patient reports: Movement: Present Contractions: Irritability  Objective   Flow sheet Vitals: Fundal Height: 30 cm Fetal Heart Rate (bpm): 145 Presentation: Vertex Total weight gain: 21 lb (9.526 kg)  General Appearance  No acute distress, well appearing, and well nourished Pulmonary   Normal work of breathing Neurologic   Alert and oriented to person, place, and time Psychiatric   Mood and affect within normal limits  Lindalou Hose Chimamanda Siegfried, CNM  09/03/248:42 AM

## 2022-10-31 ENCOUNTER — Ambulatory Visit (INDEPENDENT_AMBULATORY_CARE_PROVIDER_SITE_OTHER): Payer: Medicaid Other | Admitting: Obstetrics and Gynecology

## 2022-10-31 ENCOUNTER — Encounter: Payer: Self-pay | Admitting: Obstetrics and Gynecology

## 2022-10-31 VITALS — BP 107/68 | HR 89 | Wt 209.0 lb

## 2022-10-31 DIAGNOSIS — Z3480 Encounter for supervision of other normal pregnancy, unspecified trimester: Secondary | ICD-10-CM

## 2022-10-31 DIAGNOSIS — Z23 Encounter for immunization: Secondary | ICD-10-CM | POA: Diagnosis not present

## 2022-10-31 DIAGNOSIS — Z2911 Encounter for prophylactic immunotherapy for respiratory syncytial virus (RSV): Secondary | ICD-10-CM

## 2022-10-31 DIAGNOSIS — Z3A33 33 weeks gestation of pregnancy: Secondary | ICD-10-CM

## 2022-10-31 DIAGNOSIS — R87610 Atypical squamous cells of undetermined significance on cytologic smear of cervix (ASC-US): Secondary | ICD-10-CM

## 2022-10-31 DIAGNOSIS — Z3483 Encounter for supervision of other normal pregnancy, third trimester: Secondary | ICD-10-CM

## 2022-10-31 LAB — POCT URINALYSIS DIPSTICK OB
Bilirubin, UA: NEGATIVE
Blood, UA: NEGATIVE
Glucose, UA: NEGATIVE
Ketones, UA: NEGATIVE
Leukocytes, UA: NEGATIVE
Nitrite, UA: NEGATIVE
Spec Grav, UA: 1.01 (ref 1.010–1.025)
Urobilinogen, UA: 0.2 E.U./dL
pH, UA: 6.5 (ref 5.0–8.0)

## 2022-10-31 NOTE — Progress Notes (Signed)
ROB: Patient is a 32 y.o. G3P0020 at [redacted]w[redacted]d who presents for routine OB care.  Pregnancy is complicated by PCOS (polycystic ovarian syndrome); Asthma; Obesity (BMI 35.0-39.9 without comorbidity); and Pap smear cannot exclude high grade squamous intraepithelial lesion (ASC-H).   Patient has complaints of occasional cramping. Also noting fatigue. States she knows these are normal in pregnancy.  Declined flu vaccine but accepted RSV vaccine.  Discussed labor plans, desires to try to go without an epidural but not opposed to it. Discussed contraception, plans on condoms. Discussed BMI, approaching 40. Will likely need to begin antenatal testing starting at 36 weeks. Patient is walking more, has been watching her diet. Notes that her clothes are actually less tight, feels like she's losing inches. Does note some swelling in her feet and legs, may potentially be having water weight. BPs wnl. FHT detected very low in pelvis (suprapubic). Confirm presentation next visit. RTC in 2 weeks.

## 2022-11-12 ENCOUNTER — Encounter: Payer: Self-pay | Admitting: Certified Nurse Midwife

## 2022-11-12 ENCOUNTER — Ambulatory Visit (INDEPENDENT_AMBULATORY_CARE_PROVIDER_SITE_OTHER): Payer: Medicaid Other | Admitting: Certified Nurse Midwife

## 2022-11-12 ENCOUNTER — Other Ambulatory Visit (HOSPITAL_COMMUNITY)
Admission: RE | Admit: 2022-11-12 | Discharge: 2022-11-12 | Disposition: A | Discharge status: Home / Self Care | Payer: Medicaid Other | Source: Ambulatory Visit | Attending: Certified Nurse Midwife | Admitting: Certified Nurse Midwife

## 2022-11-12 VITALS — BP 117/80 | HR 83 | Wt 208.0 lb

## 2022-11-12 DIAGNOSIS — O26893 Other specified pregnancy related conditions, third trimester: Secondary | ICD-10-CM

## 2022-11-12 DIAGNOSIS — N898 Other specified noninflammatory disorders of vagina: Secondary | ICD-10-CM | POA: Diagnosis present

## 2022-11-12 DIAGNOSIS — Z3A34 34 weeks gestation of pregnancy: Secondary | ICD-10-CM

## 2022-11-12 NOTE — Progress Notes (Signed)
    Problem Prenatal Note   Subjective   32 y.o. [id number] at [redacted]w[redacted]d presents for this problem visit for watery vaginal discharge.  Patient reports a feeling of wetness, about quarter sized spot on underwear, laid down & when getting up again noticed wet spot on underwear. Also feeling increased pelvic/vaginal pressure and at times feels swollen. Last IC yesterday. Denies vaginal bleeding, contractions, endorses usual fetal movement. Patient reports: Movement: Present Contractions: Irritability  Objective   Flow sheet Vitals: Pulse Rate: 83 BP: 117/80 Fetal Heart Rate (bpm): 130 Total weight gain: 25 lb (11.3 kg)  General Appearance  No acute distress, well appearing, and well nourished Pulmonary   Normal work of breathing Neurologic   Alert and oriented to person, place, and time Psychiatric   Mood and affect within normal limits GU: SSE reveals small amount white discharge in vaginal vault. Cervix visually closed. Pooling negative. Nitrazine negative. Wet prep negative for yeast, BV. Ferning negative.  Assessment/Plan   Plan  32 y.o. G3P0020 at [redacted]w[redacted]d presents for follow-up OB visit. Reviewed prenatal record including previous visit note. 1. Vaginal discharge during pregnancy in third trimester - Cervicovaginal ancillary only  2. [redacted] weeks gestation of pregnancy  Reviewed with Ayano that wetness is likely normal discharge in pregnancy and that on exam no evidence of amniotic fluid. Aptima swab sent. Follow up as scheduled Friday. Reviewed call signs & when to present to L&D.  No orders of the defined types were placed in this encounter.  No follow-ups on file.   Future Appointments  Date Time Provider Department Center  11/15/2022  8:55 AM Glenetta Borg, CNM [facility] None    For next visit:  continue with routine prenatal care     Dominica Severin, CNM  11/11/2409:44 AM

## 2022-11-13 LAB — CERVICOVAGINAL ANCILLARY ONLY
Bacterial Vaginitis (gardnerella): NEGATIVE
Candida Glabrata: NEGATIVE
Candida Vaginitis: NEGATIVE
Comment: NEGATIVE
Comment: NEGATIVE
Comment: NEGATIVE

## 2022-11-15 ENCOUNTER — Encounter: Payer: Self-pay | Admitting: Obstetrics

## 2022-11-15 ENCOUNTER — Ambulatory Visit (INDEPENDENT_AMBULATORY_CARE_PROVIDER_SITE_OTHER): Payer: Medicaid Other | Admitting: Obstetrics

## 2022-11-15 VITALS — BP 111/76 | HR 81 | Wt 211.0 lb

## 2022-11-15 DIAGNOSIS — E669 Obesity, unspecified: Secondary | ICD-10-CM

## 2022-11-15 DIAGNOSIS — Z3480 Encounter for supervision of other normal pregnancy, unspecified trimester: Secondary | ICD-10-CM

## 2022-11-15 NOTE — Assessment & Plan Note (Signed)
-  Weekly NSTs starting at next visit

## 2022-11-15 NOTE — Progress Notes (Signed)
    Return Prenatal Note   Assessment/Plan   Plan  32 y.o. G3P0020 at [redacted]w[redacted]d presents for follow-up OB visit. Reviewed prenatal record including previous visit note.  Obesity (BMI 35.0-39.9 without comorbidity) -Weekly NSTs starting at next visit  Supervision of other normal pregnancy, antepartum -Discussed GBS and GC/chlamydia swabs at next visit -Reviewed s/s of labor and when to go to the hospital -Discussed sleep hygiene, use of Unisom/Benadryl    No orders of the defined types were placed in this encounter.  Return in about 1 week (around 11/22/2022).   Future Appointments  Date Time Provider Department Center  11/21/2022  9:00 AM AOB-NST ROOM [facility] None  11/21/2022  9:35 AM Mirna Mires, CNM [facility] None    For next visit:   ROB, NST     Subjective   Tina Gutierrez is sleeping poorly and feeling exhausted. She wants to stop working soon. She is getting prepared for birth.  Movement: Present Contractions: Irritability  Objective   Flow sheet Vitals: Pulse Rate: 81 BP: 111/76 Fundal Height: 35 cm Fetal Heart Rate (bpm): 140 Total weight gain: 28 lb (12.7 kg)  General Appearance  No acute distress, well appearing, and well nourished Pulmonary   Normal work of breathing Neurologic   Alert and oriented to person, place, and time Psychiatric   Mood and affect within normal limits  Tina Gutierrez, CNM 11/15/22 9:40 AM

## 2022-11-15 NOTE — Assessment & Plan Note (Signed)
-  Discussed GBS and GC/chlamydia swabs at next visit -Reviewed s/s of labor and when to go to the hospital -Discussed sleep hygiene, use of Unisom/Benadryl

## 2022-11-16 ENCOUNTER — Observation Stay
Admission: EM | Admit: 2022-11-16 | Discharge: 2022-11-16 | Disposition: A | Discharge status: Home / Self Care | Payer: Medicaid Other | Attending: Obstetrics and Gynecology | Admitting: Certified Nurse Midwife

## 2022-11-16 ENCOUNTER — Other Ambulatory Visit: Payer: Self-pay

## 2022-11-16 ENCOUNTER — Encounter: Payer: Self-pay | Admitting: Obstetrics and Gynecology

## 2022-11-16 DIAGNOSIS — O26893 Other specified pregnancy related conditions, third trimester: Principal | ICD-10-CM | POA: Insufficient documentation

## 2022-11-16 DIAGNOSIS — O368330 Maternal care for abnormalities of the fetal heart rate or rhythm, third trimester, not applicable or unspecified: Secondary | ICD-10-CM | POA: Diagnosis not present

## 2022-11-16 DIAGNOSIS — Z3A35 35 weeks gestation of pregnancy: Secondary | ICD-10-CM | POA: Insufficient documentation

## 2022-11-16 DIAGNOSIS — R030 Elevated blood-pressure reading, without diagnosis of hypertension: Secondary | ICD-10-CM | POA: Diagnosis present

## 2022-11-16 DIAGNOSIS — E669 Obesity, unspecified: Secondary | ICD-10-CM

## 2022-11-16 DIAGNOSIS — Z3480 Encounter for supervision of other normal pregnancy, unspecified trimester: Principal | ICD-10-CM

## 2022-11-16 MED ORDER — ACETAMINOPHEN 325 MG PO TABS: 650.0000 mg | ORAL_TABLET | ORAL | Status: DC | PRN

## 2022-11-16 NOTE — Progress Notes (Signed)
Patient is a  32 yo, G3 P0, at 35 weeks 2 days. Patient presents with complaints of elevated pressure at home.  Patient denies any vaginal bleeding or LOF. Patient reports +FM. Monitors applied and assessing. VSS. Initial fetal heart tone 155 bpm. Keitha Butte, CNM notified of patients arrival to unit. Plan to monitor pressures.

## 2022-11-16 NOTE — Discharge Summary (Signed)
LABOR & DELIVERY OB TRIAGE NOTE  SUBJECTIVE  HPI Tina Gutierrez is a 32 y.o. G3P0020 at [redacted]w[redacted]d who presents to Labor & Delivery for elevated blood pressure at home. She was seen in clinic earlier this week and due to increased swelling advised to assess blood pressure at home, has been normotensive in clinic. She reports she had a mild headache that resolved with a nap earlier today, denies visual changes or RUQ pain. Blood pressures at home reported as 130s/90s, last around 545pm and was 135/93. She did not bring home cuff with her. Denies loss of fluid, vaginal bleeding or contractions. Endorses fetal movement. Pregnancy complicated by obesity, asthma, abnormal pap-colpo recommended postpartum  OB History     Gravida  3   Para  0   Term  0   Preterm  0   AB  2   Living  0      SAB  2   IAB  0   Ectopic  0   Multiple  0   Live Births  0            PRN Meds:.acetaminophen  OBJECTIVE  BP 115/70   Pulse 79   Temp 98.2 F (36.8 C) (Oral)   LMP 02/11/2022 (Exact Date)  Temp:  [98.2 F (36.8 C)] 98.2 F (36.8 C) (10/05 1935) Pulse Rate:  [79-88] 79 (10/05 2020) BP: (115-136)/(68-72) 115/70 (10/05 2020)  General: A&Ox3, NAD Heart: regular rate Lungs: normal work of breathing Abdomen: gravid, soft, nontender Cervical exam:   deferred  NST I reviewed the NST and it was reactive.  Baseline: 140 Variability: moderate Accelerations: present Decelerations:none Toco: quiet, soft resting tone Category I  ASSESSMENT Impression  1) Pregnancy at G3P0020, [redacted]w[redacted]d, Estimated Date of Delivery: 12/19/22 2) Reassuring maternal/fetal status 3) Normotensive and asymptomatic of pre-eclampsia  PLAN Discharge home with preterm labor & pre-eclampsia precautions. Recommend to bring home cuff with her to next scheduled appointment to ensure accuracy. Maintain next visit as scheduled Thursday 10/10 for ROB & NST. Dominica Severin, CNM 11/16/22  8:22 PM

## 2022-11-21 ENCOUNTER — Ambulatory Visit (INDEPENDENT_AMBULATORY_CARE_PROVIDER_SITE_OTHER): Payer: Medicaid Other | Admitting: Obstetrics

## 2022-11-21 ENCOUNTER — Ambulatory Visit (INDEPENDENT_AMBULATORY_CARE_PROVIDER_SITE_OTHER): Payer: Medicaid Other

## 2022-11-21 ENCOUNTER — Other Ambulatory Visit (HOSPITAL_COMMUNITY)
Admission: RE | Admit: 2022-11-21 | Discharge: 2022-11-21 | Disposition: A | Discharge status: Home / Self Care | Payer: Medicaid Other | Source: Ambulatory Visit | Attending: Obstetrics | Admitting: Obstetrics

## 2022-11-21 VITALS — BP 98/63 | HR 80 | Wt 211.9 lb

## 2022-11-21 VITALS — BP 98/63 | HR 80 | Ht 61.0 in | Wt 211.9 lb

## 2022-11-21 DIAGNOSIS — Z3480 Encounter for supervision of other normal pregnancy, unspecified trimester: Secondary | ICD-10-CM | POA: Insufficient documentation

## 2022-11-21 DIAGNOSIS — Z3A36 36 weeks gestation of pregnancy: Secondary | ICD-10-CM | POA: Diagnosis not present

## 2022-11-21 DIAGNOSIS — Z112 Encounter for screening for other bacterial diseases: Secondary | ICD-10-CM

## 2022-11-21 DIAGNOSIS — Z113 Encounter for screening for infections with a predominantly sexual mode of transmission: Secondary | ICD-10-CM | POA: Diagnosis present

## 2022-11-21 DIAGNOSIS — E669 Obesity, unspecified: Secondary | ICD-10-CM

## 2022-11-21 DIAGNOSIS — O99213 Obesity complicating pregnancy, third trimester: Secondary | ICD-10-CM | POA: Diagnosis not present

## 2022-11-21 LAB — POCT URINALYSIS DIPSTICK OB
Bilirubin, UA: NEGATIVE
Blood, UA: NEGATIVE
Glucose, UA: NEGATIVE
Ketones, UA: NEGATIVE
Leukocytes, UA: NEGATIVE
Nitrite, UA: NEGATIVE
POC,PROTEIN,UA: NEGATIVE
Spec Grav, UA: <=1.005 — AB (ref 1.010–1.025)
Urobilinogen, UA: 0.2 U/dL
pH, UA: 8 (ref 5.0–8.0)

## 2022-11-21 NOTE — Progress Notes (Signed)
Routine Prenatal Care Visit  Subjective  Tina Gutierrez is a 32 y.o. G3P0020 at [redacted]w[redacted]d being seen today for ongoing prenatal care.  She is currently monitored for the following issues for this high-risk pregnancy and has Abnormal uterine bleeding; Menometrorrhagia; PCOS (polycystic ovarian syndrome); Supervision of other normal pregnancy, antepartum; Asthma; Obesity (BMI 35.0-39.9 without comorbidity); Pap smear cannot exclude high grade squamous intraepithelial lesion (ASC-H); Elevated blood-pressure reading without diagnosis of hypertension; Obesity affecting pregnancy in third trimester; and [redacted] weeks gestation of pregnancy on their problem list.  ----------------------------------------------------------------------------------- Patient reports no complaints.  NST is reactive today Contractions: Irritability. Vag. Bleeding: None.  Movement: Present. Leaking Fluid denies.  ----------------------------------------------------------------------------------- The following portions of the patient's history were reviewed and updated as appropriate: allergies, current medications, past family history, past medical history, past social history, past surgical history and problem list. Problem list updated.  Objective  Blood pressure 98/63, pulse 80, weight 211 lb 14.4 oz (96.1 kg), last menstrual period 02/11/2022. Pregravid weight 183 lb (83 kg) Total Weight Gain 28 lb 14.4 oz (13.1 kg) Urinalysis: Urine Protein Negative  Urine Glucose Negative  Fetal Status:     Movement: Present     General:  Alert, oriented and cooperative. Patient is in no acute distress.  Skin: Skin is warm and dry. No rash noted.   Cardiovascular: Normal heart rate noted  Respiratory: Normal respiratory effort, no problems with respiration noted  Abdomen: Soft, gravid, appropriate for gestational age. Pain/Pressure: Present     Pelvic:  Cervical exam deferred        Extremities: Normal range of motion.     Mental Status:  Normal mood and affect. Normal behavior. Normal judgment and thought content.   Assessment   32 y.o. [id number] at [redacted]w[redacted]d by  12/19/2022, by Ultrasound presenting for routine prenatal visit  Plan   third Problems (from 04/22/22 to present)     Problem Noted Resolved   Obesity (BMI 35.0-39.9 without comorbidity) 06/07/2022 by Ellwood Sayers, CNM No   Supervision of other normal pregnancy, antepartum 04/22/2022 by Loran Senters, CMA No   Overview Addendum 10/31/2022 10:16 AM by Hildred Laser, MD     Clinical Staff Provider  Office Location  [facility] Dating  7wkUS  Language  English Anatomy US  Normal   Flu Vaccine  Declined  Genetic Screen  NIPS: MaterniT21 negative, xx  TDaP vaccine   09/30/2022 Hgb A1C or  GTT Early : Third trimester : 128  RSV vaccine 10/31/2022    Covid No boosters   LAB RESULTS   Rhogam  A/Positive/-- (04/16 4132)  Blood Type A/Positive/-- (04/16 0835)   Feeding Plan breast Antibody Negative (04/16 0835)  Contraception condoms Rubella 2.56 (04/16 0835)  Circumcision no RPR Non Reactive (08/19 1004)   Pediatrician  [facility] HBsAg Negative (04/16 0835)   Support Person Lynnell Chad HIV Non Reactive (08/19 1004)  Prenatal Classes yes Varicella Immune    GBS  (For PCN allergy, check sensitivities)   BTL Consent  Hep C Non Reactive (04/16 0835)   VBAC Consent  Pap Diagnosis  Date Value Ref Range Status  06/07/2022 (A)  Final   - Atypical squamous cells, cannot exclude high grade squamous  06/07/2022 intraepithelial lesion (ASC-H) (A)  Final      Hgb Electro      CF      SMA  Preterm labor symptoms and general obstetric precautions including but not limited to vaginal bleeding, contractions, leaking of fluid and fetal movement were reviewed in detail with the patient. Please refer to After Visit Summary for other counseling recommendations.  Cultures done today.  Return in about 1 week (around 11/28/2022) for  return OB, NST. Her blood rpessure is low normal otday. Discussed likely IOL at 39 weeks due to high BMI. Please set up next week.  Mirna Mires, CNM  11/21/2022 10:19 AM

## 2022-11-21 NOTE — Progress Notes (Signed)
    NURSE VISIT NOTE  Subjective:    Patient ID: Tina Gutierrez, female    DOB: September 05, 1990, 32 y.o.   MRN: [id number]  HPI  Patient is a 32 y.o. G72P0020 female who presents for fetal monitoring per order from Guadlupe Spanish, CNM.   Objective:    BP 98/63   Pulse 80   Ht 5\' 1"  (1.549 m)   Wt 211 lb 14.4 oz (96.1 kg)   LMP 02/11/2022 (Exact Date)   BMI 40.04 kg/m  Estimated Date of Delivery: 12/19/22  [redacted]w[redacted]d  Fetus A Non-Stress Test Interpretation for 11/21/22  Indication: Obesity  Fetal Heart Rate A Mode: External Baseline Rate (A): 140 bpm Variability: Moderate Accelerations: 15 x 15 Decelerations: None Multiple birth?: No  Uterine Activity Mode: Toco Contraction Frequency (min): none  Interpretation (Fetal Testing) Nonstress Test Interpretation: Reactive Overall Impression: Reassuring for gestational age   Assessment:   1. Obesity affecting pregnancy in third trimester, unspecified obesity type   2. [redacted] weeks gestation of pregnancy      Plan:   Results reviewed and discussed with patient by  Paula Compton, CNM.     Rocco Serene, LPN

## 2022-11-21 NOTE — Patient Instructions (Signed)

## 2022-11-22 ENCOUNTER — Encounter: Payer: Self-pay | Admitting: Obstetrics

## 2022-11-22 LAB — CERVICOVAGINAL ANCILLARY ONLY
Chlamydia: NEGATIVE
Comment: NEGATIVE
Comment: NORMAL
Neisseria Gonorrhea: NEGATIVE

## 2022-11-23 LAB — STREP GP B NAA: Strep Gp B NAA: POSITIVE — AB

## 2022-11-29 ENCOUNTER — Other Ambulatory Visit: Payer: Medicaid Other

## 2022-11-29 ENCOUNTER — Other Ambulatory Visit: Payer: Self-pay

## 2022-11-29 ENCOUNTER — Encounter: Payer: Self-pay | Admitting: Obstetrics and Gynecology

## 2022-11-29 ENCOUNTER — Observation Stay
Admission: EM | Admit: 2022-11-29 | Discharge: 2022-11-29 | Disposition: A | Discharge status: Home / Self Care | Payer: Medicaid Other

## 2022-11-29 ENCOUNTER — Ambulatory Visit (INDEPENDENT_AMBULATORY_CARE_PROVIDER_SITE_OTHER): Payer: Medicaid Other | Admitting: Obstetrics and Gynecology

## 2022-11-29 VITALS — BP 115/72 | HR 83 | Wt 214.0 lb

## 2022-11-29 DIAGNOSIS — E669 Obesity, unspecified: Secondary | ICD-10-CM

## 2022-11-29 DIAGNOSIS — O368330 Maternal care for abnormalities of the fetal heart rate or rhythm, third trimester, not applicable or unspecified: Secondary | ICD-10-CM | POA: Diagnosis not present

## 2022-11-29 DIAGNOSIS — O99213 Obesity complicating pregnancy, third trimester: Secondary | ICD-10-CM | POA: Diagnosis not present

## 2022-11-29 DIAGNOSIS — Z3A37 37 weeks gestation of pregnancy: Secondary | ICD-10-CM | POA: Diagnosis not present

## 2022-11-29 DIAGNOSIS — E663 Overweight: Secondary | ICD-10-CM

## 2022-11-29 DIAGNOSIS — O368390 Maternal care for abnormalities of the fetal heart rate or rhythm, unspecified trimester, not applicable or unspecified: Secondary | ICD-10-CM | POA: Diagnosis present

## 2022-11-29 DIAGNOSIS — O368930 Maternal care for other specified fetal problems, third trimester, not applicable or unspecified: Secondary | ICD-10-CM | POA: Diagnosis present

## 2022-11-29 DIAGNOSIS — Z3480 Encounter for supervision of other normal pregnancy, unspecified trimester: Principal | ICD-10-CM

## 2022-11-29 LAB — POCT URINALYSIS DIPSTICK OB
Bilirubin, UA: NEGATIVE
Blood, UA: NEGATIVE
Glucose, UA: NEGATIVE
Ketones, UA: NEGATIVE
Leukocytes, UA: NEGATIVE
Nitrite, UA: NEGATIVE
POC,PROTEIN,UA: NEGATIVE
Spec Grav, UA: 1.01 (ref 1.010–1.025)
Urobilinogen, UA: 1 U/dL
pH, UA: 6.5 (ref 5.0–8.0)

## 2022-11-29 NOTE — Progress Notes (Signed)
ROB: Patient is a 32 y.o. G3P0020 at [redacted]w[redacted]d who presents for routine OB care.  Pregnancy is complicated by: PCOS (polycystic ovarian syndrome); Supervision of other normal pregnancy, antepartum; Asthma; Obesity (BMI 35.0-39.9 without comorbidity); Pap smear cannot exclude high grade squamous intraepithelial lesion (ASC-H); Elevated blood-pressure reading without diagnosis of hypertension.   Patient has complaints of decreased fetal movement yesterday and experiencing sharp pains in her lower abdomen on the right side that lasted ~ 30 minutes and then resolved. Discussed signs and symptoms of labor, also reviewed red flag symptoms and when to call/follow up. NST performed today for elevated BMI, several deep variable decelerations noted (a total of 5 throughout the tracing), baseline 150s-160s and nadir of decelerations 90-100s.  No contractions appreciated. Discussed need for further monitoring on L&D. Patient notes understanding. Is GBS+, will need antibiotics in labor.  RTC in 1 week with NST    NONSTRESS TEST INTERPRETATION  INDICATIONS: Obesity  FHR baseline: 150-160 bpm Accelerations: present Decelerations: Present - Variables (deep) Variability: moderate in degree Tocometry: No contractions, occasional uterine irritability.   RESULTS:Reactive COMMENTS: Sent to L&D for further evaluation.    PLAN: 1. Continue fetal kick counts twice a day. 2. Continue antepartum testing as scheduled-weekly

## 2022-11-29 NOTE — OB Triage Note (Signed)
32 yo G3P0 presents to L&D triage at [redacted]w[redacted]d for a non-stress test. Pt was seen in clinic today and was noted to have variables on her NST. Pt denies ctx's, pain, vaginal bleeding, lof and reports feeling baby move today. Vitals wnl, monitors applied and assessing. POC includes extended fetal monitoring. Free CNM aware of pt arrival.

## 2022-11-29 NOTE — Patient Instructions (Signed)
Natural Cervical Ripening Supplements  Red Raspberry Leaf Tea 2-4 cups daily.  Start at [redacted] weeks gestation and continue until labor.     Evening Primrose Oil (1000 mg) Take twice daily - by mouth in the morning and vaginally at bedtime. (If previous C-section, take both doses by mouth) Start at 38 weeks and continue until labor    Blue Cohosh 1 capsule daily starting at [redacted] weeks gestation 2 capsules daily starting at [redacted] weeks gestation 3 capsules daily starting at [redacted] weeks gestation  

## 2022-11-29 NOTE — OB Triage Note (Signed)
LABOR & DELIVERY OB TRIAGE NOTE  SUBJECTIVE  HPI Tina Gutierrez is a 32 y.o. G3P0020 at [redacted]w[redacted]d who presents to Labor & Delivery for extended fetal heart rate monitoring.  Patient was seen in clinic today for routine ROB and NST due to BMI. NST in clinic appeared to have multiple variables, so she was sent to L&D triage for extended monitoring. Denies contractions, LOF, vaginal bleeding. Has been feeling fetal movement.   OB History     Gravida  3   Para  0   Term  0   Preterm  0   AB  2   Living  0      SAB  2   IAB  0   Ectopic  0   Multiple  0   Live Births  0            OBJECTIVE  BP 129/78 (BP Location: Left Arm)   Pulse 90   Temp 98.3 F (36.8 C) (Oral)   Resp 18   Ht 5\' 1"  (1.549 m)   Wt 97.1 kg   LMP 02/11/2022 (Exact Date)   BMI 40.44 kg/m   General: AOx4 Heart: normal heart rate Lungs: normal work of breathing Abdomen: gravid, soft, non-tender Cervical exam:   deferred  NST I reviewed the NST and it was reactive.  Baseline: 130 Variability: moderate Accelerations: present Decelerations:none Toco: uterine irritability Category I  ASSESSMENT Impression  1) Pregnancy at G3P0020, [redacted]w[redacted]d, Estimated Date of Delivery: 12/19/22 2) Reassuring maternal/fetal status after approximately 1 hour of fetal monitoring.   PLAN 1) Provided reassurance to patient and partner.  2) Discharged in stable condition with routine labor precautions.   Lindalou Hose Alton Tremblay, CNM  11/29/22  6:02 PM

## 2022-11-29 NOTE — OB Triage Note (Signed)
NST was reactive w/ 15x15 accelerations, cat 1 strip. Pt being discharged by Free, CNM. Discharge instructions reviewed w pt and pt educated to return for lof, reduced fetal movement, ctx's, or vaginal bleeding. Pt verbalized understanding, sent stable and ambulatory.

## 2022-12-02 ENCOUNTER — Encounter: Payer: Self-pay | Admitting: Obstetrics and Gynecology

## 2022-12-05 DIAGNOSIS — Z3A38 38 weeks gestation of pregnancy: Secondary | ICD-10-CM | POA: Insufficient documentation

## 2022-12-05 NOTE — Patient Instructions (Signed)

## 2022-12-05 NOTE — Progress Notes (Addendum)
    NURSE VISIT NOTE  Subjective:    Patient ID: Tina Gutierrez, female    DOB: 10-12-1990, 32 y.o.   MRN: [id number]  HPI  Patient is a 32 y.o. G67P1021 female who presents for fetal monitoring per order from Hildred Laser, MD.   Objective:    BP 122/84   Pulse 74   Ht 5\' 1"  (1.549 m)   Wt 214 lb (97.1 kg)   LMP 02/11/2022 (Exact Date)   BMI 40.43 kg/m  Estimated Date of Delivery: 12/19/22  [redacted]w[redacted]d  Fetus A Non-Stress Test Interpretation for 12/12/22  Indication: Obesity  Fetal Heart Rate A Mode: External Baseline Rate (A): 160 bpm Variability: Moderate Accelerations: 15 x 15 Decelerations: None Multiple birth?: No  Uterine Activity Mode: Toco Contraction Frequency (min): UI  Interpretation (Fetal Testing) Nonstress Test Interpretation: Reactive Overall Impression: Reassuring for gestational age   Assessment:   1. Obesity affecting pregnancy in third trimester, unspecified obesity type   2. [redacted] weeks gestation of pregnancy      Plan:   Results reviewed and discussed with patient by  Hildred Laser, MD.     Rocco Serene, LPN

## 2022-12-06 ENCOUNTER — Encounter: Payer: Self-pay | Admitting: Obstetrics and Gynecology

## 2022-12-06 ENCOUNTER — Ambulatory Visit: Payer: Medicaid Other

## 2022-12-06 ENCOUNTER — Ambulatory Visit (INDEPENDENT_AMBULATORY_CARE_PROVIDER_SITE_OTHER): Payer: Medicaid Other | Admitting: Obstetrics and Gynecology

## 2022-12-06 VITALS — BP 122/84 | HR 74 | Wt 214.0 lb

## 2022-12-06 VITALS — BP 122/84 | HR 74 | Ht 61.0 in | Wt 214.0 lb

## 2022-12-06 DIAGNOSIS — E669 Obesity, unspecified: Secondary | ICD-10-CM | POA: Diagnosis not present

## 2022-12-06 DIAGNOSIS — Z3A38 38 weeks gestation of pregnancy: Secondary | ICD-10-CM

## 2022-12-06 DIAGNOSIS — Z3480 Encounter for supervision of other normal pregnancy, unspecified trimester: Secondary | ICD-10-CM

## 2022-12-06 DIAGNOSIS — O99213 Obesity complicating pregnancy, third trimester: Secondary | ICD-10-CM

## 2022-12-06 NOTE — Progress Notes (Signed)
ROB [redacted]w[redacted]d: She feels tired today. She reports good fetal movement. She has no new concerns.

## 2022-12-06 NOTE — Progress Notes (Signed)
ROB: Patient is a 32 y.o. G3P0020 at [redacted]w[redacted]d who presents for routine OB care.  Pregnancy is complicated by: PCOS (polycystic ovarian syndrome); Supervision of other normal pregnancy, antepartum; Asthma; Obesity (BMI 35.0-39.9 without comorbidity); Pap smear cannot exclude high grade squamous intraepithelial lesion (ASC-H); and Obesity affecting pregnancy in third trimester.   Patient has complaints of fatigue, back pain, and pelvic pressure. Also noting decreased appetite.  Understands that this is normal at the end stage of pregnancy.  Discussed IOL for obesity.  Patient okay to schedule, date confirmed for 12/19/2022 at midnight.  Discussed details of IOL.  Reviewed signs and symptoms of labor.  Advised to continue use of natural cervical ripening methods.  Clarified dosing and administration route.NST performed today by Rocco Serene, RN was reviewed and was found to be reactive.  Continue recommended antenatal testing and prenatal care.  RTC in 1 week for routine OB care and NST. Marland Kitchen

## 2022-12-09 ENCOUNTER — Encounter: Payer: Self-pay | Admitting: Obstetrics and Gynecology

## 2022-12-11 ENCOUNTER — Other Ambulatory Visit: Payer: Self-pay

## 2022-12-11 ENCOUNTER — Encounter
Admission: EM | Disposition: A | Discharge status: Home / Self Care | Payer: Self-pay | Source: Home / Self Care | Attending: Obstetrics

## 2022-12-11 ENCOUNTER — Encounter: Payer: Self-pay | Admitting: Obstetrics and Gynecology

## 2022-12-11 ENCOUNTER — Inpatient Hospital Stay: Payer: Medicaid Other | Admitting: Anesthesiology

## 2022-12-11 ENCOUNTER — Inpatient Hospital Stay
Admission: EM | Admit: 2022-12-11 | Discharge: 2022-12-14 | DRG: 788 | Disposition: A | Discharge status: Home / Self Care | Payer: Medicaid Other | Attending: Certified Nurse Midwife | Admitting: Certified Nurse Midwife

## 2022-12-11 DIAGNOSIS — O99214 Obesity complicating childbirth: Secondary | ICD-10-CM | POA: Diagnosis present

## 2022-12-11 DIAGNOSIS — O9902 Anemia complicating childbirth: Secondary | ICD-10-CM | POA: Diagnosis present

## 2022-12-11 DIAGNOSIS — O4202 Full-term premature rupture of membranes, onset of labor within 24 hours of rupture: Secondary | ICD-10-CM

## 2022-12-11 DIAGNOSIS — Z7982 Long term (current) use of aspirin: Secondary | ICD-10-CM

## 2022-12-11 DIAGNOSIS — Z9049 Acquired absence of other specified parts of digestive tract: Secondary | ICD-10-CM

## 2022-12-11 DIAGNOSIS — Z3A38 38 weeks gestation of pregnancy: Secondary | ICD-10-CM | POA: Diagnosis not present

## 2022-12-11 DIAGNOSIS — Z833 Family history of diabetes mellitus: Secondary | ICD-10-CM

## 2022-12-11 DIAGNOSIS — O321XX0 Maternal care for breech presentation, not applicable or unspecified: Secondary | ICD-10-CM | POA: Diagnosis present

## 2022-12-11 DIAGNOSIS — Z820 Family history of epilepsy and other diseases of the nervous system: Secondary | ICD-10-CM

## 2022-12-11 DIAGNOSIS — E669 Obesity, unspecified: Secondary | ICD-10-CM

## 2022-12-11 DIAGNOSIS — Z3480 Encounter for supervision of other normal pregnancy, unspecified trimester: Principal | ICD-10-CM

## 2022-12-11 DIAGNOSIS — O4292 Full-term premature rupture of membranes, unspecified as to length of time between rupture and onset of labor: Secondary | ICD-10-CM | POA: Diagnosis present

## 2022-12-11 LAB — CBC
HCT: 38.3 % (ref 36.0–46.0)
Hemoglobin: 12.8 g/dL (ref 12.0–15.0)
MCH: 26.7 pg (ref 26.0–34.0)
MCHC: 33.4 g/dL (ref 30.0–36.0)
MCV: 79.8 fL — ABNORMAL LOW (ref 80.0–100.0)
Platelets: 324 10*3/uL (ref 150–400)
RBC: 4.8 MIL/uL (ref 3.87–5.11)
RDW: 14.1 % (ref 11.5–15.5)
WBC: 12 10*3/uL — ABNORMAL HIGH (ref 4.0–10.5)
nRBC: 0 % (ref 0.0–0.2)

## 2022-12-11 LAB — RUPTURE OF MEMBRANE (ROM)PLUS: Rom Plus: POSITIVE

## 2022-12-11 LAB — RPR: RPR Ser Ql: NONREACTIVE

## 2022-12-11 LAB — TYPE AND SCREEN
ABO/RH(D): A POS
Antibody Screen: NEGATIVE

## 2022-12-11 SURGERY — Surgical Case
Anesthesia: Spinal

## 2022-12-11 MED ORDER — FENTANYL CITRATE (PF) 100 MCG/2ML IJ SOLN
50.0000 ug | INTRAMUSCULAR | Status: DC | PRN
Start: 2022-12-11 — End: 2022-12-11

## 2022-12-11 MED ORDER — ONDANSETRON HCL 4 MG/2ML IJ SOLN
INTRAMUSCULAR | Status: DC | PRN
  Administered 2022-12-11: 4 mg via INTRAVENOUS

## 2022-12-11 MED ORDER — ONDANSETRON HCL 4 MG/2ML IJ SOLN
4.0000 mg | Freq: Three times a day (TID) | INTRAMUSCULAR | Status: DC | PRN
  Administered 2022-12-11: 4 mg via INTRAVENOUS
  Filled 2022-12-11: qty 2

## 2022-12-11 MED ORDER — DEXAMETHASONE SODIUM PHOSPHATE 10 MG/ML IJ SOLN
INTRAMUSCULAR | Status: AC
  Filled 2022-12-11: qty 1

## 2022-12-11 MED ORDER — NALOXONE HCL 4 MG/10ML IJ SOLN: 1.0000 ug/kg/h | INTRAVENOUS | Status: DC | PRN

## 2022-12-11 MED ORDER — ONDANSETRON HCL 4 MG/2ML IJ SOLN: 4.0000 mg | Freq: Four times a day (QID) | INTRAMUSCULAR | Status: DC | PRN

## 2022-12-11 MED ORDER — BUPIVACAINE IN DEXTROSE 0.75-8.25 % IT SOLN
INTRATHECAL | Status: DC | PRN
  Administered 2022-12-11: 1.5 mL via INTRATHECAL

## 2022-12-11 MED ORDER — PHENYLEPHRINE 80 MCG/ML (10ML) SYRINGE FOR IV PUSH (FOR BLOOD PRESSURE SUPPORT)
PREFILLED_SYRINGE | INTRAVENOUS | Status: DC | PRN
  Administered 2022-12-11 (×3): 160 ug via INTRAVENOUS

## 2022-12-11 MED ORDER — MISOPROSTOL 50MCG HALF TABLET
50.0000 ug | ORAL_TABLET | Freq: Once | ORAL | Status: DC
  Filled 2022-12-11: qty 1

## 2022-12-11 MED ORDER — ONDANSETRON HCL 4 MG/2ML IJ SOLN
INTRAMUSCULAR | Status: AC
  Filled 2022-12-11: qty 2

## 2022-12-11 MED ORDER — PHENYLEPHRINE HCL-NACL 20-0.9 MG/250ML-% IV SOLN
INTRAVENOUS | Status: AC
  Filled 2022-12-11: qty 250

## 2022-12-11 MED ORDER — MENTHOL 3 MG MT LOZG: 1.0000 | LOZENGE | OROMUCOSAL | Status: DC | PRN

## 2022-12-11 MED ORDER — KETOROLAC TROMETHAMINE 30 MG/ML IJ SOLN
30.0000 mg | Freq: Four times a day (QID) | INTRAMUSCULAR | Status: AC | PRN
  Administered 2022-12-11 (×2): 30 mg via INTRAVENOUS
  Filled 2022-12-11 (×2): qty 1

## 2022-12-11 MED ORDER — SODIUM CHLORIDE 0.9 % IV SOLN
500.0000 mg | INTRAVENOUS | Status: AC
  Administered 2022-12-11: 500 mg via INTRAVENOUS

## 2022-12-11 MED ORDER — KETOROLAC TROMETHAMINE 30 MG/ML IJ SOLN
INTRAMUSCULAR | Status: DC | PRN
  Administered 2022-12-11: 30 mg via INTRAVENOUS

## 2022-12-11 MED ORDER — OXYTOCIN-SODIUM CHLORIDE 30-0.9 UT/500ML-% IV SOLN
INTRAVENOUS | Status: AC
  Filled 2022-12-11: qty 500

## 2022-12-11 MED ORDER — FENTANYL CITRATE (PF) 100 MCG/2ML IJ SOLN
INTRAMUSCULAR | Status: DC | PRN
  Administered 2022-12-11: 10 ug via INTRATHECAL

## 2022-12-11 MED ORDER — ACETAMINOPHEN 500 MG PO TABS
1000.0000 mg | ORAL_TABLET | Freq: Four times a day (QID) | ORAL | Status: AC
  Administered 2022-12-11 – 2022-12-12 (×2): 1000 mg via ORAL
  Administered 2022-12-12: 500 mg via ORAL
  Filled 2022-12-11 (×3): qty 2

## 2022-12-11 MED ORDER — PENICILLIN G POT IN DEXTROSE 60000 UNIT/ML IV SOLN
3.0000 10*6.[IU] | INTRAVENOUS | Status: DC
Start: 2022-12-11 — End: 2022-12-11

## 2022-12-11 MED ORDER — CALCIUM CARBONATE ANTACID 500 MG PO CHEW: 2.0000 | CHEWABLE_TABLET | ORAL | Status: DC | PRN

## 2022-12-11 MED ORDER — LIDOCAINE HCL (PF) 1 % IJ SOLN
INTRAMUSCULAR | Status: AC
  Filled 2022-12-11: qty 30

## 2022-12-11 MED ORDER — ALBUTEROL SULFATE (2.5 MG/3ML) 0.083% IN NEBU: 2.5000 mg | INHALATION_SOLUTION | Freq: Once | RESPIRATORY_TRACT | Status: DC

## 2022-12-11 MED ORDER — ACETAMINOPHEN 325 MG PO TABS: 650.0000 mg | ORAL_TABLET | ORAL | Status: DC | PRN

## 2022-12-11 MED ORDER — ZOLPIDEM TARTRATE 5 MG PO TABS: 5.0000 mg | ORAL_TABLET | Freq: Every evening | ORAL | Status: DC | PRN

## 2022-12-11 MED ORDER — METOCLOPRAMIDE HCL 5 MG/ML IJ SOLN
10.0000 mg | Freq: Once | INTRAMUSCULAR | Status: AC
  Administered 2022-12-11: 10 mg via INTRAVENOUS
  Filled 2022-12-11: qty 2

## 2022-12-11 MED ORDER — ONDANSETRON HCL 4 MG/2ML IJ SOLN
INTRAMUSCULAR | Status: AC
  Administered 2022-12-11: 4 mg
  Filled 2022-12-11: qty 2

## 2022-12-11 MED ORDER — LACTATED RINGERS IV SOLN: INTRAVENOUS | Status: AC

## 2022-12-11 MED ORDER — LACTATED RINGERS IV SOLN: INTRAVENOUS | Status: DC

## 2022-12-11 MED ORDER — DEXAMETHASONE SODIUM PHOSPHATE 10 MG/ML IJ SOLN
INTRAMUSCULAR | Status: DC | PRN
  Administered 2022-12-11: 10 mg via INTRAVENOUS

## 2022-12-11 MED ORDER — OXYTOCIN-SODIUM CHLORIDE 30-0.9 UT/500ML-% IV SOLN: 1.0000 m[IU]/min | INTRAVENOUS | Status: DC

## 2022-12-11 MED ORDER — PRENATAL MULTIVITAMIN CH
1.0000 | ORAL_TABLET | Freq: Every day | ORAL | Status: DC
Start: 2022-12-11 — End: 2022-12-14
  Administered 2022-12-12 – 2022-12-14 (×3): 1 via ORAL
  Filled 2022-12-11 (×3): qty 1

## 2022-12-11 MED ORDER — OXYCODONE-ACETAMINOPHEN 5-325 MG PO TABS
1.0000 | ORAL_TABLET | ORAL | Status: DC | PRN
Start: 2022-12-11 — End: 2022-12-14
  Administered 2022-12-12 – 2022-12-14 (×5): 1 via ORAL
  Filled 2022-12-11 (×5): qty 1

## 2022-12-11 MED ORDER — IBUPROFEN 600 MG PO TABS
600.0000 mg | ORAL_TABLET | Freq: Four times a day (QID) | ORAL | Status: AC
  Administered 2022-12-12 – 2022-12-14 (×10): 600 mg via ORAL
  Filled 2022-12-11 (×10): qty 1

## 2022-12-11 MED ORDER — OXYTOCIN-SODIUM CHLORIDE 30-0.9 UT/500ML-% IV SOLN
2.5000 [IU]/h | INTRAVENOUS | Status: AC
Start: 2022-12-11 — End: 2022-12-12
  Administered 2022-12-11: 2.5 [IU]/h via INTRAVENOUS

## 2022-12-11 MED ORDER — SODIUM CHLORIDE 0.9 % IV SOLN
INTRAVENOUS | Status: AC
  Filled 2022-12-11: qty 5

## 2022-12-11 MED ORDER — CEFAZOLIN SODIUM-DEXTROSE 2-4 GM/100ML-% IV SOLN
2.0000 g | INTRAVENOUS | Status: AC
  Administered 2022-12-11: 2 g via INTRAVENOUS

## 2022-12-11 MED ORDER — LIDOCAINE HCL (PF) 1 % IJ SOLN: 30.0000 mL | INTRAMUSCULAR | Status: DC | PRN

## 2022-12-11 MED ORDER — SOD CITRATE-CITRIC ACID 500-334 MG/5ML PO SOLN: 30.0000 mL | ORAL | Status: DC | PRN

## 2022-12-11 MED ORDER — SODIUM CHLORIDE 0.9% FLUSH
3.0000 mL | INTRAVENOUS | Status: DC | PRN
Start: 2022-12-11 — End: 2022-12-14

## 2022-12-11 MED ORDER — SODIUM CHLORIDE 0.9 % IV SOLN
5.0000 10*6.[IU] | Freq: Once | INTRAVENOUS | Status: AC
Start: 2022-12-11 — End: 2022-12-11
  Administered 2022-12-11: 5 10*6.[IU] via INTRAVENOUS
  Filled 2022-12-11: qty 5

## 2022-12-11 MED ORDER — CEFAZOLIN SODIUM-DEXTROSE 2-4 GM/100ML-% IV SOLN
INTRAVENOUS | Status: AC
  Filled 2022-12-11: qty 100

## 2022-12-11 MED ORDER — SOD CITRATE-CITRIC ACID 500-334 MG/5ML PO SOLN
ORAL | Status: AC
  Administered 2022-12-11: 30 mL via ORAL
  Filled 2022-12-11: qty 15

## 2022-12-11 MED ORDER — FENTANYL CITRATE (PF) 100 MCG/2ML IJ SOLN
INTRAMUSCULAR | Status: AC
  Filled 2022-12-11: qty 2

## 2022-12-11 MED ORDER — PHENYLEPHRINE HCL-NACL 20-0.9 MG/250ML-% IV SOLN
INTRAVENOUS | Status: DC | PRN
  Administered 2022-12-11: 40 ug/min via INTRAVENOUS

## 2022-12-11 MED ORDER — LACTATED RINGERS IV SOLN: 500.0000 mL | INTRAVENOUS | Status: DC | PRN

## 2022-12-11 MED ORDER — CHLORHEXIDINE GLUCONATE 0.12 % MT SOLN
OROMUCOSAL | Status: AC
  Administered 2022-12-11: 15 mL
  Filled 2022-12-11: qty 15

## 2022-12-11 MED ORDER — AMMONIA AROMATIC IN INHA
RESPIRATORY_TRACT | Status: AC
  Filled 2022-12-11: qty 10

## 2022-12-11 MED ORDER — MISOPROSTOL 200 MCG PO TABS
ORAL_TABLET | ORAL | Status: AC
  Filled 2022-12-11: qty 4

## 2022-12-11 MED ORDER — SOD CITRATE-CITRIC ACID 500-334 MG/5ML PO SOLN: 30.0000 mL | ORAL | Status: AC

## 2022-12-11 MED ORDER — KETOROLAC TROMETHAMINE 30 MG/ML IJ SOLN: 30.0000 mg | Freq: Four times a day (QID) | INTRAMUSCULAR | Status: AC | PRN

## 2022-12-11 MED ORDER — OXYTOCIN-SODIUM CHLORIDE 30-0.9 UT/500ML-% IV SOLN
2.5000 [IU]/h | INTRAVENOUS | Status: DC
  Administered 2022-12-11: 20 [IU]/h via INTRAVENOUS

## 2022-12-11 MED ORDER — DIPHENHYDRAMINE HCL 25 MG PO CAPS: 25.0000 mg | ORAL_CAPSULE | ORAL | Status: DC | PRN

## 2022-12-11 MED ORDER — SENNOSIDES-DOCUSATE SODIUM 8.6-50 MG PO TABS
2.0000 | ORAL_TABLET | ORAL | Status: DC
Start: 2022-12-11 — End: 2022-12-14
  Administered 2022-12-12 – 2022-12-14 (×3): 2 via ORAL
  Filled 2022-12-11 (×3): qty 2

## 2022-12-11 MED ORDER — TERBUTALINE SULFATE 1 MG/ML IJ SOLN: 0.2500 mg | Freq: Once | INTRAMUSCULAR | Status: DC | PRN

## 2022-12-11 MED ORDER — LIDOCAINE 5 % EX PTCH
MEDICATED_PATCH | CUTANEOUS | Status: AC
  Filled 2022-12-11: qty 1

## 2022-12-11 MED ORDER — MORPHINE SULFATE (PF) 0.5 MG/ML IJ SOLN
INTRAMUSCULAR | Status: AC
  Filled 2022-12-11: qty 10

## 2022-12-11 MED ORDER — OXYTOCIN BOLUS FROM INFUSION: 333.0000 mL | Freq: Once | INTRAVENOUS | Status: DC

## 2022-12-11 MED ORDER — DIPHENHYDRAMINE HCL 25 MG PO CAPS: 25.0000 mg | ORAL_CAPSULE | Freq: Four times a day (QID) | ORAL | Status: DC | PRN

## 2022-12-11 MED ORDER — MISOPROSTOL 50MCG HALF TABLET: 50.0000 ug | ORAL_TABLET | ORAL | Status: DC | PRN

## 2022-12-11 MED ORDER — METOCLOPRAMIDE HCL 5 MG/ML IJ SOLN
INTRAMUSCULAR | Status: DC | PRN
  Administered 2022-12-11: 10 mg via INTRAVENOUS

## 2022-12-11 MED ORDER — DIPHENHYDRAMINE HCL 50 MG/ML IJ SOLN: 12.5000 mg | INTRAMUSCULAR | Status: DC | PRN

## 2022-12-11 MED ORDER — HYDROXYZINE HCL 25 MG PO TABS: 50.0000 mg | ORAL_TABLET | Freq: Four times a day (QID) | ORAL | Status: DC | PRN

## 2022-12-11 MED ORDER — MISOPROSTOL 25 MCG QUARTER TABLET
25.0000 ug | ORAL_TABLET | Freq: Once | ORAL | Status: DC
  Filled 2022-12-11: qty 1

## 2022-12-11 MED ORDER — LIDOCAINE HCL (PF) 1 % IJ SOLN
INTRAMUSCULAR | Status: DC | PRN
  Administered 2022-12-11: 3 mL via SUBCUTANEOUS

## 2022-12-11 MED ORDER — MORPHINE SULFATE (PF) 0.5 MG/ML IJ SOLN
INTRAMUSCULAR | Status: DC | PRN
  Administered 2022-12-11: 150 ug via INTRATHECAL

## 2022-12-11 MED ORDER — NALOXONE HCL 0.4 MG/ML IJ SOLN: 0.4000 mg | INTRAMUSCULAR | Status: DC | PRN

## 2022-12-11 MED ORDER — KETOROLAC TROMETHAMINE 30 MG/ML IJ SOLN
INTRAMUSCULAR | Status: AC
  Filled 2022-12-11: qty 1

## 2022-12-11 MED ORDER — SIMETHICONE 80 MG PO CHEW
80.0000 mg | CHEWABLE_TABLET | Freq: Four times a day (QID) | ORAL | Status: DC
  Administered 2022-12-11 – 2022-12-14 (×9): 80 mg via ORAL
  Filled 2022-12-11 (×9): qty 1

## 2022-12-11 MED ORDER — OXYTOCIN 10 UNIT/ML IJ SOLN
INTRAMUSCULAR | Status: AC
  Filled 2022-12-11: qty 2

## 2022-12-11 SURGICAL SUPPLY — 29 items
ADH LQ OCL WTPRF AMP STRL LF (MISCELLANEOUS) ×1
ADHESIVE MASTISOL STRL (MISCELLANEOUS) ×1 IMPLANT
APL PRP STRL LF DISP 70% ISPRP (MISCELLANEOUS) ×2
BAG COUNTER SPONGE SURGICOUNT (BAG) ×1 IMPLANT
BAG SPNG CNTER NS LX DISP (BAG) ×1
CHLORAPREP W/TINT 26 (MISCELLANEOUS) ×2 IMPLANT
CLIP FILSHIE TUBAL LIGA STRL (Clip) IMPLANT
DRSG TELFA 3X8 NADH STRL (GAUZE/BANDAGES/DRESSINGS) ×1 IMPLANT
GAUZE SPONGE 4X4 12PLY STRL (GAUZE/BANDAGES/DRESSINGS) ×1 IMPLANT
GLOVE PI ORTHO PRO STRL 7.5 (GLOVE) ×1 IMPLANT
GOWN STRL REUS W/ TWL LRG LVL3 (GOWN DISPOSABLE) ×2 IMPLANT
GOWN STRL REUS W/TWL LRG LVL3 (GOWN DISPOSABLE) ×2
KIT TURNOVER KIT A (KITS) ×1 IMPLANT
MANIFOLD NEPTUNE II (INSTRUMENTS) ×1 IMPLANT
MAT PREVALON FULL STRYKER (MISCELLANEOUS) ×1 IMPLANT
NS IRRIG 1000ML POUR BTL (IV SOLUTION) ×1 IMPLANT
PACK C SECTION AR (MISCELLANEOUS) ×1 IMPLANT
PAD OB MATERNITY 4.3X12.25 (PERSONAL CARE ITEMS) ×1 IMPLANT
PAD PREP OB/GYN DISP 24X41 (PERSONAL CARE ITEMS) ×1 IMPLANT
RETRACTOR WND ALEXIS-O 25 LRG (MISCELLANEOUS) ×1 IMPLANT
RTRCTR WOUND ALEXIS O 25CM LRG (MISCELLANEOUS) ×1
SCRUB CHG 4% DYNA-HEX 4OZ (MISCELLANEOUS) ×1 IMPLANT
SPONGE T-LAP 18X18 ~~LOC~~+RFID (SPONGE) ×1 IMPLANT
SUT VIC AB 0 CTX 36 (SUTURE) ×2
SUT VIC AB 0 CTX36XBRD ANBCTRL (SUTURE) ×2 IMPLANT
SUT VIC AB 1 CT1 36 (SUTURE) ×2 IMPLANT
SUT VICRYL+ 3-0 36IN CT-1 (SUTURE) ×2 IMPLANT
TRAP FLUID SMOKE EVACUATOR (MISCELLANEOUS) ×1 IMPLANT
WATER STERILE IRR 500ML POUR (IV SOLUTION) ×1 IMPLANT

## 2022-12-11 NOTE — OB Triage Note (Signed)
Patient is a 32 yo, G3P0, at 38 weeks 6 days. Patient presents with complaints of LOF.  Patient denies any vaginal bleeding. Patient reports +FM. Monitors applied and assessing. Initial fetal heart tone is 140. M. Swanson CNM notified of patients arrival to unit. Plan to do a ROM plus

## 2022-12-11 NOTE — Discharge Summary (Signed)
Postpartum Discharge Summary    Patient Name: Tina Gutierrez DOB: 05/05/1990 MRN: [id number]  Date of admission: 12/11/2022 Delivery date:12/11/2022 Delivering provider: Linzie Collin Date of discharge: 12/14/2022  Admitting diagnosis: Labor and delivery, indication for care [O75.9] Intrauterine pregnancy: [redacted]w[redacted]d     Secondary diagnosis:  Principal Problem:   Labor and delivery, indication for care  Additional problems: Breech presentation    Discharge diagnosis: Term Pregnancy Delivered                                              Post partum procedures:none Augmentation: N/A Complications:   Hospital course: Onset of Labor With Unplanned C/S   32 y.o. yo [id number] at [redacted]w[redacted]d was admitted with PROM on 12/11/2022 and was subsequently found to be breech. Cesarean section was called. The patient went for cesarean section due to Malpresentation. Delivery details as follows: Membrane Rupture Time/Date: 1:00 AM,12/11/2022  Delivery Method:C-Section, Low Transverse Operative Delivery:N/A Details of operation can be found in separate operative note. Patient had an uncomplicated postpartum course.  She is ambulating,tolerating a regular diet, passing flatus, and urinating well.  Patient is discharged home in stable condition 12/14/22.  Newborn Data: Birth date:12/11/2022 Birth time:6:47 AM Gender:Female Living status:Living Apgars:8 ,9  Weight:2380 g  Magnesium Sulfate received: No BMZ received: No Rhophylac:N/A MMR:N/A T-DaP:Given prenatally Flu:  RSV Vaccine received: Yes Transfusion:No Immunizations administered: Immunization History  Administered Date(s) Administered   [employer] Covid-19 Vaccine Bivalent Booster 61yrs & up 10/26/2020, 11/25/2020   Rsv, Bivalent, Protein Subunit Rsvpref,pf Verdis Frederickson) 10/31/2022   Tdap 09/30/2022    Physical exam  Vitals:   12/13/22 1615 12/13/22 2015 12/13/22 2325 12/14/22 0833  BP: 118/63 120/78 (!) 98/48 112/72  Pulse: 79 85  81 73  Resp: 20 18 20 20   Temp: 98.3 F (36.8 C) 99.3 F (37.4 C) 98.5 F (36.9 C)   TempSrc: Oral Oral Oral   SpO2: 99% 98% 100% 100%  Weight:      Height:       General: alert, cooperative, and no distress Lochia: appropriate Uterine Fundus: firm Incision: Healing well with no significant drainage, Dressing is clean, dry, and intact DVT Evaluation: No evidence of DVT seen on physical exam. Labs: Lab Results  Component Value Date   WBC 11.5 (H) 12/12/2022   HGB 10.0 (L) 12/12/2022   HCT 29.0 (L) 12/12/2022   MCV 78.0 (L) 12/12/2022   PLT 275 12/12/2022      Latest Ref Rng & Units 05/07/2022    8:08 PM  CMP  Glucose 70 - 99 mg/dL 147   BUN 6 - 20 mg/dL 10   Creatinine 8.29 - 1.00 mg/dL 5.62   Sodium 130 - 865 mmol/L 136   Potassium 3.5 - 5.1 mmol/L 3.5   Chloride 98 - 111 mmol/L 103   CO2 22 - 32 mmol/L 20   Calcium 8.9 - 10.3 mg/dL 9.2   Total Protein 6.5 - 8.1 g/dL 7.1   Total Bilirubin 0.3 - 1.2 mg/dL 0.4   Alkaline Phos 38 - 126 U/L 45   AST 15 - 41 U/L 18   ALT 0 - 44 U/L 24    Edinburgh Score:    12/13/2022    5:55 AM  Edinburgh Postnatal Depression Scale Screening Tool  I have been able to laugh and see the funny side of things.  0  I have looked forward with enjoyment to things. 0  I have blamed myself unnecessarily when things went wrong. 2  I have been anxious or worried for no good reason. 2  I have felt scared or panicky for no good reason. 1  Things have been getting on top of me. 1  I have been so unhappy that I have had difficulty sleeping. 0  I have felt sad or miserable. 1  I have been so unhappy that I have been crying. 1  The thought of harming myself has occurred to me. 0  Edinburgh Postnatal Depression Scale Total 8      After visit meds:  Allergies as of 12/14/2022       Reactions   Other Itching   Pistachios = severe swelling, walnuts, bananas, apples = itching        Medication List     STOP taking these medications     aspirin 81 MG chewable tablet       TAKE these medications    fluticasone 50 MCG/ACT nasal spray Commonly known as: FLONASE Place into both nostrils as needed.   ibuprofen 600 MG tablet Commonly known as: ADVIL Take 1 tablet (600 mg total) by mouth every 6 (six) hours.   multivitamin-prenatal 27-0.8 MG Tabs tablet Take 2 tablets by mouth daily at 12 noon.   oxyCODONE-acetaminophen 5-325 MG tablet Commonly known as: PERCOCET/ROXICET Take 1-2 tablets by mouth every 4 (four) hours as needed for moderate pain (pain score 4-6).         Discharge home in stable condition Infant Feeding: Breast Infant Disposition:home with mother Discharge instruction: per After Visit Summary and Postpartum booklet. Activity: Advance as tolerated. Pelvic rest for 6 weeks.  Diet: routine diet Anticipated Birth Control:  Desires Financial risk analyst which has worked well for her in the past Postpartum Appointment: 6 weeks with colposcopy Additional Postpartum F/U: Postpartum Depression checkup and Incision check 1 week Future Appointments: No future appointments.  Follow up Visit:  Follow-up Information     Linzie Collin, MD. Schedule an appointment as soon as possible for a visit in 1 week(s).   Specialties: Obstetrics and Gynecology, Radiology Why: For inicision check Contact information: [address] [place] [address] [phone]         [facility] [place]. Schedule an appointment as soon as possible for a visit.   Specialty: Obstetrics and Gynecology Why: Telehealth appointment in 2 weeks and in-person appointment in 6 weeks with colposcopy Contact information: [address] [place] [phone]                    12/14/2022 Lindalou Hose Jacqueline Delapena, CNM

## 2022-12-11 NOTE — Anesthesia Preprocedure Evaluation (Signed)
Anesthesia Evaluation  Patient identified by MRN, date of birth, ID band Patient awake    Reviewed: Allergy & Precautions, H&P , NPO status , Patient's Chart, lab work & pertinent test results, reviewed documented beta blocker date and time   Airway Mallampati: II  TM Distance: >3 FB Neck ROM: full    Dental no notable dental hx. (+) Teeth Intact   Pulmonary asthma    Pulmonary exam normal breath sounds clear to auscultation       Cardiovascular Exercise Tolerance: Good negative cardio ROS  Rhythm:regular Rate:Normal     Neuro/Psych negative neurological ROS  negative psych ROS   GI/Hepatic negative GI ROS, Neg liver ROS,,,  Endo/Other  diabetes, Well Controlled, Gestational  Morbid obesity  Renal/GU negative Renal ROS  negative genitourinary   Musculoskeletal   Abdominal   Peds  Hematology  (+) Blood dyscrasia, anemia   Anesthesia Other Findings   Reproductive/Obstetrics (+) Pregnancy                             Anesthesia Physical Anesthesia Plan  ASA: 3 and emergent  Anesthesia Plan: Spinal   Post-op Pain Management:    Induction:   PONV Risk Score and Plan:   Airway Management Planned:   Additional Equipment:   Intra-op Plan:   Post-operative Plan:   Informed Consent: I have reviewed the patients History and Physical, chart, labs and discussed the procedure including the risks, benefits and alternatives for the proposed anesthesia with the patient or authorized representative who has indicated his/her understanding and acceptance.       Plan Discussed with:   Anesthesia Plan Comments:        Anesthesia Quick Evaluation

## 2022-12-11 NOTE — H&P (Signed)
[facility] Labor & Delivery  History and Physical  ASSESSMENT AND PLAN   Tina Gutierrez Age is a 32 y.o. G3P0020 at [redacted]w[redacted]d with EDD: 12/19/2022, by Ultrasound admitted for PROM. BSUS shows breech presentation. Discussed findings with Kenneth and her partner, who are in agreement with plan for primary cesarean birth.  -Admit to L&D -Admission labs -Dr. Logan Bores notified  -Plan to proceed with cesarean birth -Anesthesia notified  Fetal Status: - breech presentation by BSUS - EFW: 7.5-8 lbs by Leopold's - FHT currently cat 1/2  Labs/Immunizations: Prenatal labs and studies: ABO, Rh: A/Positive/-- (04/16 0835) Antibody: Negative (04/16 0835) Rubella: 2.56 (04/16 0835) Varicella: 415 (04/16 0835)  RPR: Non Reactive (08/19 1004)  HBsAg: Negative (04/16 0835)  HepC: Non Reactive (04/16 0835) HIV: Non Reactive (08/19 1004)  GBS: Positive/-- (10/10 1056)   TDAP: Received prenatally Flu:  No RSV: Yes  Postpartum Plan: - Feeding: Breast Milk and Formula - Prenatal Care Provider: AOB   HPI   Chief Complaint: "My water broke"  Tina Gutierrez is a 32 y.o. G3P0020 at [redacted]w[redacted]d who presents for LOF. She reports that she had a gush of clear/blood tinged fluid at 0100 and has continued to leak. She denies ctx but is having some cramping. She endorses good fetal movement. ROM Plus was positive in triage. .  Pregnancy Complications Patient Active Problem List   Diagnosis Date Noted   Labor and delivery, indication for care 12/11/2022   [redacted] weeks gestation of pregnancy 12/05/2022   Non-reassuring electronic fetal monitoring tracing 11/29/2022   Obesity affecting pregnancy in third trimester 11/21/2022   Asthma 06/07/2022   Obesity (BMI 35.0-39.9 without comorbidity) 06/07/2022   Pap smear cannot exclude high grade squamous intraepithelial lesion (ASC-H) 06/07/2022   Supervision of other normal pregnancy, antepartum 04/22/2022   PCOS (polycystic ovarian syndrome)  11/21/2021   Menometrorrhagia 07/05/2019    Review of Systems A twelve point review of systems was negative except as stated in HPI.   HISTORY   Medications Medications Prior to Admission  Medication Sig Dispense Refill Last Dose   aspirin 81 MG chewable tablet Chew 1 tablet (81 mg total) by mouth daily.   Past Week   fluticasone (FLONASE) 50 MCG/ACT nasal spray Place into both nostrils as needed.   Past Month   Prenatal Vit-Fe Fumarate-FA (MULTIVITAMIN-PRENATAL) 27-0.8 MG TABS tablet Take 2 tablets by mouth daily at 12 noon.   Past Week    Allergies Pistachios, walnuts, bananas, apples  OB History OB History  Gravida Para Term Preterm AB Living  3 0 0 0 2 0  SAB IAB Ectopic Multiple Live Births  2 0 0 0 0    # Outcome Date GA Lbr Len/2nd Weight Sex Type Anes PTL Lv  3 Current           2 SAB 02/2022     SAB        Birth Comments: chemical  1 SAB 2012     SAB       Past Medical History Past Medical History:  Diagnosis Date   Abnormal uterine bleeding 06/27/2017   Anemia    Ovarian cyst    Symptomatic anemia     Past Surgical History Past Surgical History:  Procedure Laterality Date   CHOLECYSTECTOMY  01/2012   [facility]    Social History  reports that she has never smoked. She has never used smokeless tobacco. She reports that she does not drink alcohol and does not use  drugs.   Family History family history includes Alzheimer's disease in her paternal grandfather; Dementia in her paternal grandfather; Diabetes in her maternal grandfather; Healthy in her brother, brother, father, maternal grandfather, maternal grandmother, mother, and paternal grandmother.   PHYSICAL EXAM   Vitals:   12/11/22 0200 12/11/22 0335 12/11/22 0340  BP: 127/69    Pulse: 74    Resp: 16    Temp: 98.4 F (36.9 C)  98.9 F (37.2 C)  TempSrc: Oral  Oral  Weight:  97.1 kg   Height:  5\' 1"  (1.549 m)     Constitutional: No acute distress, well appearing, and well  nourished. Neurologic: She is alert and conversational.  Psychiatric: She has a normal mood and affect.  Musculoskeletal: Normal gait, grossly normal range of motion Cardiovascular: RRR. Pulmonary/Chest: CTAB. Normal work of breathing.  Gastrointestinal/Abdominal: Soft. Gravid. There is no tenderness.  Skin: Skin is warm and dry. No rash noted.  Genitourinary: Normal external female genitalia.   SVE:   Dilation: 1.5 Effacement (%): 30 Station: Ballotable Exam by:: Ecologist RN  NST Interpretation  Baseline: 130 bpm Variability: moderate Accelerations: present Decelerations: unable to determine - gap in tracing Contractions: irregular, every 2-5 minutes, mild Time noted:  See OBIX Impression: reactive   Glenetta Borg, CNM

## 2022-12-11 NOTE — Anesthesia Procedure Notes (Signed)
Spinal  Patient location during procedure: OR Start time: 12/11/2022 6:10 AM End time: 12/11/2022 6:18 AM Reason for block: surgical anesthesia Staffing Performed: resident/CRNA  Anesthesiologist: Yevette Edwards, MD Resident/CRNA: Katherine Basset, CRNA Performed by: Katherine Basset, CRNA Authorized by: Yevette Edwards, MD   Preanesthetic Checklist Completed: patient identified, IV checked, site marked, risks and benefits discussed, surgical consent, monitors and equipment checked, pre-op evaluation and timeout performed Spinal Block Patient position: sitting Prep: ChloraPrep Patient monitoring: heart rate, cardiac monitor, continuous pulse ox and blood pressure Approach: midline Location: L3-4 Injection technique: single-shot Needle Needle type: Pencan  Needle gauge: 25 G Needle length: 9 cm Assessment Sensory level: T4 Events: CSF return Additional Notes IV functioning, monitors applied to pt. Expiration date of kit checked and confirmed to be in date. Sterile prep and drape, hand hygiene and sterile gloved used. Pt was positioned and spine was prepped in sterile fashion. Skin was anesthetized with lidocaine. Free flow of clear CSF obtained prior to injecting local anesthetic into CSF x 1 attempt. Spinal needle aspirated freely following injection. Needle was carefully withdrawn, and pt tolerated procedure well. Loss of motor and sensory on exam post injection.

## 2022-12-11 NOTE — Op Note (Signed)
     OP NOTE  Date: 12/11/2022   7:18 AM Name Tina Gutierrez MR# [id number]  Preoperative Diagnosis: 1. Intrauterine pregnancy at [redacted]w[redacted]d Principal Problem:   Labor and delivery, indication for care  2.  Delcie Roch (confirmed by [place]) 3.  PROM  Postoperative Diagnosis: 1. Intrauterine pregnancy at [redacted]w[redacted]d, delivered 2. Viable infant 3. Remainder same as pre-op   Procedure: 1. Primary Low-Transverse Cesarean Section  Surgeon: Elonda Husky, MD  Assistant:  Chryl Heck, CNM  Anesthesia: Spinal   EBL: 375 ml     Findings: 1) female infant, Apgar scores of 8   at 1 minute and 9   at 5 minutes and a birthweight of 83.95 ounces.    2) Normal uterus, tubes and ovaries.    Procedure:  The patient was prepped and draped in the supine position and placed under spinal anesthesia.  A transverse incision was made across the abdomen in a Pfannenstiel manner. If indicated the old scar was systematically removed with sharp dissection.  We carried the dissection down to the level of the fascia.  The fascia was incised in a curvilinear manner.  The fascia was then elevated from the rectus muscles with blunt and sharp dissection.  The rectus muscles were separated laterally exposing the peritoneum.  The peritoneum was carefully entered with care being taken to avoid bowel and bladder.  A self-retaining retractor was placed.  The visceral peritoneum was incised in a curvilinear fashion across the lower uterine segment creating a bladder flap. A transverse incision was made across the lower uterine segment and extended laterally and superiorly with blunt dissection.  The infant was delivered from the breech position.  After an appropriate time interval, the cord was doubly clamped and cut. Cord blood was obtained if required.  The infant was handed to the pediatric personnel  who then placed the infant under heat lamps where it was cleaned dried and suctioned as needed. The placenta was  delivered. The hysterotomy incision was then identified on ring forceps.  The uterine cavity was cleaned with a moist lap sponge.  The hysterotomy incision was closed with a running interlocking suture of Vicryl.  Hemostasis was excellent.  Pitocin was run in the IV and the uterus was found to be firm. The posterior cul-de-sac and gutters were cleaned and inspected.  Hemostasis was noted.  The fascia was then closed with a running suture of #1 Vicryl.  Hemostasis of the subcutaneous tissues was obtained using the Bovie.  The subcutaneous tissues were closed with a running suture of 000 Vicryl.  A subcuticular suture was placed.  Steri-strips were applied in the usual manner.  A Lidoderm patch was applied.  A pressure dressing was placed.  The patient went to the recovery room in stable condition. Swanson CNM provided exposure, dissection, suctioning, retraction, and general support and assistance during the procedure.    Elonda Husky, M.D. 12/11/2022 7:18 AM

## 2022-12-11 NOTE — Transfer of Care (Signed)
Immediate Anesthesia Transfer of Care Note  Patient: Tina Gutierrez  Procedure(s) Performed: CESAREAN SECTION  Patient Location: PACU and Mother/Baby  Anesthesia Type:Spinal  Level of Consciousness: awake, alert , and oriented  Airway & Oxygen Therapy: Patient Spontanous Breathing  Post-op Assessment: Report given to RN and Post -op Vital signs reviewed and stable  Post vital signs: Reviewed and stable  Last Vitals:  Vitals Value Taken Time  BP    Temp    Pulse    Resp    SpO2      Last Pain:  Vitals:   12/11/22 0351  TempSrc:   PainSc: 3          Complications: No notable events documented.

## 2022-12-11 NOTE — Lactation Note (Signed)
This note was copied from a baby's chart. Lactation Consultation Note  Patient Name: Tina Gutierrez [id number] Date: 12/11/2022 Age:30 hours Reason for consult: Initial assessment;Primapara;Early term 37-38.6wks   Maternal Data This is mom's 1st baby, C/S malpresentation. Mom with history of anemia, PCOS, and obesity. On initial visit LC in to room and mom was independently breastfeeding baby in football hold.Per mom baby is latching and breastfeeding. Mom reports her mom has been helping her today with breastfeeding. Has patient been taught Hand Expression?: Yes Does the patient have breastfeeding experience prior to this delivery?: No  Feeding Mother's Current Feeding Choice: Breast Milk and Formula  LATCH Score Latch: Grasps breast easily, tongue down, lips flanged, rhythmical sucking.  Audible Swallowing: Spontaneous and intermittent  Type of Nipple: Everted at rest and after stimulation  Comfort (Breast/Nipple): Soft / non-tender  Hold (Positioning): No assistance needed to correctly position infant at breast.  LATCH Score: 10  Interventions Interventions: Breast feeding basics reviewed;Education Mom understands if she would like additional breastfeeding assistance to call care nurse and/or LC as needed.  Consult Status Consult Status: Follow-up Date: 12/11/22 Follow-up type: In-patient  Update provided to care nurse.  Fuller Song 12/11/2022, 7:08 PM

## 2022-12-12 LAB — CBC
HCT: 29 % — ABNORMAL LOW (ref 36.0–46.0)
Hemoglobin: 10 g/dL — ABNORMAL LOW (ref 12.0–15.0)
MCH: 26.9 pg (ref 26.0–34.0)
MCHC: 34.5 g/dL (ref 30.0–36.0)
MCV: 78 fL — ABNORMAL LOW (ref 80.0–100.0)
Platelets: 275 10*3/uL (ref 150–400)
RBC: 3.72 MIL/uL — ABNORMAL LOW (ref 3.87–5.11)
RDW: 14.3 % (ref 11.5–15.5)
WBC: 11.5 10*3/uL — ABNORMAL HIGH (ref 4.0–10.5)
nRBC: 0 % (ref 0.0–0.2)

## 2022-12-12 NOTE — Anesthesia Post-op Follow-up Note (Signed)
  Anesthesia Pain Follow-up Note  Patient: Tina Gutierrez  Day #: 1  Date of Follow-up: 12/12/2022 Time: 7:31 AM  Last Vitals:  Vitals:   12/11/22 1900 12/11/22 2300  BP: 138/69 111/66  Pulse: (!) 126 83  Resp: 20 18  Temp: 37.4 C 37.2 C  SpO2: 99% 98%    Level of Consciousness: alert  Pain: none   Side Effects:None  Catheter Site Exam:clean, dry     Plan: D/C from anesthesia care at surgeon's request  Joaquin Knebel

## 2022-12-12 NOTE — Lactation Note (Signed)
This note was copied from a baby's chart. Lactation Consultation Note  Patient Name: Tina Gutierrez [id number] Date: 12/12/2022 Age:30 hours Reason for consult: Follow-up assessment;Primapara;Early term 37-38.6wks;Infant < 6lbs;Breastfeeding assistance   Maternal Data This is mom's 1st baby, C/S malpresentation. Mom with history of anemia, PCOS, and obesity.   On follow-up visit mom reports baby is latching and breastfeeding. Mom independently breastfeeding when [facility] entered the room. Mom reports nipple tenderness. Mom does have an electric pump at home however it is not set-up. Mom would like a manual pump for home use.  Has patient been taught Hand Expression?: Yes Does the patient have breastfeeding experience prior to this delivery?: No  Feeding Mother's Current Feeding Choice: Breast Milk and Formula Observed baby with good latch and actively feeding at mom's right breast.Baby in clothes swaddled with additional blanket overlayed on to baby and mom. Mom and dad report baby does get sleepy at times at the breast. Discussed with parents: how to wake a sleepy baby, providing tactile stimulation during the feed as well as mom massaging her breast, and detaching baby and burping the baby if despite attempting to arouse the baby she does not actively feed. Discussed how to tell the baby is satiated and encouraged mom to return baby to breast despite recently feeding if baby shows feeding cues.   LATCH Score Latch: Grasps breast easily, tongue down, lips flanged, rhythmical sucking.  Audible Swallowing: Spontaneous and intermittent  Type of Nipple: Everted at rest and after stimulation  Comfort (Breast/Nipple): Filling, red/small blisters or bruises, mild/mod discomfort  Hold (Positioning): No assistance needed to correctly position infant at breast.  LATCH Score: 9   Lactation Tools Discussed/Used  Harmony manual pump: set-up, cleaning of parts, milk storage for the normal newborn at  home. Mom used pump and easily expressed 5 ml's of colostrum.  Interventions Interventions: Breast feeding basics reviewed;Support pillows;Education;Hand pump (Reviewed and provided manual pump for home use: set-up, cleaning of parts, milk storage guidelines for the normal newborn at home.) Reviewed cluster feeding: what to expect regarding clusterfeeding in the first days when breastfeeding and during expected growth spurts   Discharge Pump: Personal (Mom requested a manual pump for home use.)  Consult Status Consult Status: Follow-up Date: 12/13/22 Follow-up type: In-patient  Update provided to care nurse.  Fuller Song 12/12/2022, 12:42 PM

## 2022-12-12 NOTE — Anesthesia Postprocedure Evaluation (Signed)
Anesthesia Post Note  Patient: Tina Gutierrez  Procedure(s) Performed: CESAREAN SECTION  Patient location during evaluation: Mother Baby Anesthesia Type: Spinal Level of consciousness: oriented and awake and alert Pain management: pain level controlled Vital Signs Assessment: post-procedure vital signs reviewed and stable Respiratory status: spontaneous breathing and respiratory function stable Cardiovascular status: blood pressure returned to baseline and stable Postop Assessment: no headache, no backache, no apparent nausea or vomiting and able to ambulate Anesthetic complications: no   No notable events documented.   Last Vitals:  Vitals:   12/11/22 1900 12/11/22 2300  BP: 138/69 111/66  Pulse: (!) 126 83  Resp: 20 18  Temp: 37.4 C 37.2 C  SpO2: 99% 98%    Last Pain:  Vitals:   12/11/22 2300  TempSrc: Oral  PainSc:                  [employer]

## 2022-12-12 NOTE — Progress Notes (Signed)
Progress Note - Cesarean Delivery  Tina Gutierrez is a 32 y.o. [id number] now PP day 1 s/p C-Section, Low Transverse.   Subjective:  Patient reports no problems with eating, bowel movements, voiding, or their wound  That she was up much of the night with the baby who is cluster feeding.  She feels tired but otherwise well.  Objective:  Vital signs in last 24 hours: Temp:  [98.1 F (36.7 C)-99.4 F (37.4 C)] 98.5 F (36.9 C) (10/31 0800) Pulse Rate:  [78-126] 82 (10/31 0800) Resp:  [17-20] 18 (10/30 2300) BP: (100-138)/(55-69) 100/61 (10/31 0800) SpO2:  [97 %-100 %] 97 % (10/31 0800)  Physical Exam:  General: alert, cooperative, and no distress Lochia: appropriate Uterine Fundus: firm Incision: Dressing intact    Data Review Recent Labs    12/11/22 0338 12/12/22 0548  HGB 12.8 10.0*  HCT 38.3 29.0*    Assessment:  Principal Problem:   Labor and delivery, indication for care   Status post Cesarean section. Doing well postoperatively.     Plan:       Continue current care.  Expected discharge tomorrow.  Possible lactation consultation today if patient desires.  We have discussed this possibility.  Dressing change to honeycomb today.  Elonda Husky, M.D. 12/12/2022 8:35 AM

## 2022-12-13 ENCOUNTER — Encounter: Payer: Medicaid Other | Admitting: Certified Nurse Midwife

## 2022-12-13 ENCOUNTER — Other Ambulatory Visit: Payer: Medicaid Other

## 2022-12-13 MED ORDER — DOCUSATE SODIUM 100 MG PO CAPS
100.0000 mg | ORAL_CAPSULE | Freq: Two times a day (BID) | ORAL | Status: DC
  Administered 2022-12-13 – 2022-12-14 (×3): 100 mg via ORAL
  Filled 2022-12-13 (×3): qty 1

## 2022-12-13 NOTE — Lactation Note (Addendum)
This note was copied from a baby's chart. Lactation Consultation Note  Patient Name: Tina Gutierrez [id number] Date: 12/13/2022 Age:30 hours Reason for consult: Follow-up assessment;Primapara;Early term 37-38.6wks   Maternal Data Lactation to room for a follow up assessment and to provide discharge education to P1 patient.  Patient stated that feedings are going better.  Patient is pumping (using a manual pump) and putting infant to the breast.  Per patient her breast leak everytime it is time for infant to eat or when infant cries.  Patient is feeding infant on demand.    Patient stated that because she left home when her water broke she didn't bring any bras to hold nursing pads.   Feeding Mother's Current Feeding Choice: Breast Milk  No feeding observed.   Interventions Interventions: Breast feeding basics reviewed;Education;CDC Guidelines for Breast Pump Cleaning  Discharge Discharge Education: Engorgement and breast care;Outpatient recommendation  Education on engorgement prevention/treatment was discussed as well as breastmilk storage guidelines.  LC provided patient with a handout on breastmilk storage guidelines from [facility]. Milk production expectations were reviewed.  Inforced the importance of feeding infant at least 8x w/in a 24hr period.  [facility] outpatient lactation services phone number written on the white board in the room.  Patient verbalized understanding.  LC was able to provide infant with a belly hand to help hold her nursing pads while in the hospital.   Consult Status Consult Status: Complete Follow-up type: Call as needed    Yvette Rack Free 12/13/2022, 3:45 PM

## 2022-12-13 NOTE — Progress Notes (Signed)
Progress Note - Cesarean Delivery  Tina Gutierrez is a 32 y.o. [id number] now PP day 2 s/p C-Section, Low Transverse.   Subjective:  Patient reports abdominal distention and abdominal pain in incision  She is also experiencing gas pain,she state she has been ambulating which as helped her pass some gas.   Objective:  Vital signs in last 24 hours: Temp:  [98.2 F (36.8 C)-98.6 F (37 C)] 98.5 F (36.9 C) (10/31 2259) Pulse Rate:  [74-109] 85 (10/31 2259) Resp:  [18-20] 18 (10/31 2259) BP: (111-129)/(66-92) 111/73 (10/31 2259) SpO2:  [99 %-100 %] 100 % (10/31 2259) Weight:  [97.1 kg] 97.1 kg (10/31 1653)  Physical Exam:  General: alert, cooperative, appears stated age, and fatigued Lochia: appropriate Uterine Fundus: firm Incision: no significant drainage, no dehiscence, no significant erythema    Data Review Recent Labs    12/11/22 0338 12/12/22 0548  HGB 12.8 10.0*  HCT 38.3 29.0*    Assessment:  Principal Problem:   Labor and delivery, indication for care   Status post Cesarean section. Doing well postoperatively.   Requesting to stay until tomorrow.   Plan:       Continue current care.  Abdominal binder to help with distention and pain. Encouraged po hydration, continued ambulation , colace BID  Discharge tomorrow   Doreene Burke, CNM . 12/13/2022 8:07 AM

## 2022-12-14 MED ORDER — IBUPROFEN 600 MG PO TABS: 600.0000 mg | ORAL_TABLET | Freq: Four times a day (QID) | ORAL | Status: DC

## 2022-12-14 MED ORDER — OXYCODONE-ACETAMINOPHEN 5-325 MG PO TABS: 1.0000 | ORAL_TABLET | ORAL | 0 refills | Status: DC | PRN

## 2022-12-14 NOTE — Lactation Note (Signed)
This note was copied from a baby's chart. Lactation Consultation Note  Patient Name: Tina Gutierrez [id number] Date: 12/14/2022 Age:30 hours Reason for consult: Follow-up assessment;Primapara;Early term 37-38.6wks;Infant < 6lbs   Maternal Data    Feeding Mother's Current Feeding Choice: Breast Milk Nipple Type: Slow - flow Mom pumped 3 oz EBM this pumping session.  She states baby latches easily to breast but tires quickly, I did not observe a feeding a this time.    LATCH Score Latch:  (I did not observe a feeding at the breast)                  Lactation Tools Discussed/Used Tools: Pump Breast pump type: Double-Electric Breast Pump;Manual Reason for Pumping: low birth wt Pumping frequency: q3hr or 8x /24 hr Pumped volume: 90 mL  Interventions Interventions: Hand pump;Education Given Medela breastmilk storage magnet Discharge Pump: Manual;DEBP;Personal Demonstrated use of Harmony manual breast pump Consult Status Consult Status: PRN Date: 12/14/22 Follow-up type: In-patient    Dyann Kief 12/14/2022, 2:16 PM

## 2022-12-14 NOTE — Progress Notes (Signed)
Patient discharged. Discharge instructions given. Patient verbalizes understanding. Transported by axillary. 

## 2022-12-14 NOTE — Discharge Instructions (Signed)
Discharge instructions Bleeding: Your bleeding could continue up to 6 weeks, the flow should gradually decrease and the color should become dark then lightened over the next couple of weeks. If you notice you are bleeding heavily or passing clots larger than the size of your fist, PLEASE call your physician. No TAMPONS, DOUCHING, ENEMAS OR SEXUAL INTERCOURSE for 6 weeks. Incision: The honeycomb dressing can be removed in 5-7 days. Remove earlier if any water gets underneath it or if there is a lot of drainage. After the dressing is off, you can let the warm soapy water from the shower run over the incision and pat dry with a clean towel. Watch the incision for signs of infection, such as, redness, warmth, oozing pus. AfterPains: This is the uterus contracting back to its normal position and size. Use medications prescribed or recommended by your physician to help relieve this discomfort. Bowels/Hemorrhoids: Drink plenty of water and stay active. Increase fiber, fresh fruits and vegetables in your diet. Rest/Activity: Rest when the baby is resting;  Do not lift > 10 lbs for 6 weeks. No driving for 1-2 weeks. Bathing: Shower daily! Diet: Continue daily prenatal vitamin and iron until your follow up visit to help replenish nutrients and vitamins. If breastfeeding eat extra calories and increase your fluid intake to 12 glasses a day. Contraception: Consult with your provider on what method of birth control you would like to use. Breastfeeding: You may have a slight fever when your milk comes in, but it should go away on its own. If it does not, and rises above 101.0 please call the doctor. Bottlefeeding: wear a snug fitting bra without underwires continuously for 3-5days, avoid any nipple/breast stimulation. If engorgement occurs, take ibuprofen as prescribed and apply fresh green cabbage leaves directly to your breasts inside the bra cups. Postpartum "BLUES": It is common to emotional days after delivery,  however if it persist for greater than 2 weeks or if you feel concerned please let your physician know immediately. This is hormone driven and nothing you can control so please let someone know how you feel. Follow Up Visit: Please schedule a follow up visit with your delivering provider  Call office if you have any of the following: headache, visual changes, fever >101.0 F, chills, breast concerns, excessive vaginal bleeding, incision drainage or problems, leg pain or redness, depression or any other concerns.  For concerns about your baby, please call your pediatrician For breastfeeding concerns, the lactation consultant can be reached at [phone]

## 2022-12-24 ENCOUNTER — Ambulatory Visit (INDEPENDENT_AMBULATORY_CARE_PROVIDER_SITE_OTHER): Payer: Medicaid Other | Admitting: Obstetrics and Gynecology

## 2022-12-24 ENCOUNTER — Encounter: Payer: Self-pay | Admitting: Obstetrics and Gynecology

## 2022-12-24 VITALS — BP 96/65 | HR 62 | Ht 61.0 in | Wt 196.2 lb

## 2022-12-24 DIAGNOSIS — Z9889 Other specified postprocedural states: Secondary | ICD-10-CM

## 2022-12-24 DIAGNOSIS — Z9189 Other specified personal risk factors, not elsewhere classified: Secondary | ICD-10-CM

## 2022-12-24 NOTE — Progress Notes (Signed)
HPI:      Tina Gutierrez is a 32 y.o. [id number] who LMP was No LMP recorded.  Subjective:   She presents today approximately 2 weeks postop from cesarean delivery for breech.  She is breast-feeding but is having difficulty with the baby latching.  She states she makes plenty of milk but the baby does not like to suck.  She would like to meet with a Advertising copywriter. She reports her incision is doing fine.  She has kept the honeycomb dressing on.  She says she still has some pain but it is much improved.  She continues to have some vaginal bleeding and the last 2 days have become heavier but most of the time has been moderate.    Hx: The following portions of the patient's history were reviewed and updated as appropriate:             She  has a past medical history of Abnormal uterine bleeding (06/27/2017), Anemia, Ovarian cyst, and Symptomatic anemia. She does not have any pertinent problems on file. She  has a past surgical history that includes Cholecystectomy (01/2012) and Cesarean section (12/11/2022). Her family history includes Alzheimer's disease in her paternal grandfather; Dementia in her paternal grandfather; Diabetes in her maternal grandfather; Healthy in her brother, brother, father, maternal grandfather, maternal grandmother, mother, and paternal grandmother. She  reports that she has never smoked. She has never used smokeless tobacco. She reports that she does not drink alcohol and does not use drugs. She has a current medication list which includes the following prescription(s): fluticasone, ibuprofen, and multivitamin-prenatal. She is allergic to other.       Review of Systems:  Review of Systems  Constitutional: Denied constitutional symptoms, night sweats, recent illness, fatigue, fever, insomnia and weight loss.  Eyes: Denied eye symptoms, eye pain, photophobia, vision change and visual disturbance.  Ears/Nose/Throat/Neck: Denied ear, nose, throat or neck  symptoms, hearing loss, nasal discharge, sinus congestion and sore throat.  Cardiovascular: Denied cardiovascular symptoms, arrhythmia, chest pain/pressure, edema, exercise intolerance, orthopnea and palpitations.  Respiratory: Denied pulmonary symptoms, asthma, pleuritic pain, productive sputum, cough, dyspnea and wheezing.  Gastrointestinal: Denied, gastro-esophageal reflux, melena, nausea and vomiting.  Genitourinary: Denied genitourinary symptoms including symptomatic vaginal discharge, pelvic relaxation issues, and urinary complaints.  Musculoskeletal: Denied musculoskeletal symptoms, stiffness, swelling, muscle weakness and myalgia.  Dermatologic: Denied dermatology symptoms, rash and scar.  Neurologic: Denied neurology symptoms, dizziness, headache, neck pain and syncope.  Psychiatric: Denied psychiatric symptoms, anxiety and depression.  Endocrine: Denied endocrine symptoms including hot flashes and night sweats.   Meds:   Current Outpatient Medications on File Prior to Visit  Medication Sig Dispense Refill   fluticasone (FLONASE) 50 MCG/ACT nasal spray Place into both nostrils as needed.     ibuprofen (ADVIL) 600 MG tablet Take 1 tablet (600 mg total) by mouth every 6 (six) hours.     Prenatal Vit-Fe Fumarate-FA (MULTIVITAMIN-PRENATAL) 27-0.8 MG TABS tablet Take 2 tablets by mouth daily at 12 noon.     No current facility-administered medications on file prior to visit.      Objective:     Vitals:   12/24/22 1058  BP: 96/65  Pulse: 62   Filed Weights   12/24/22 1058  Weight: 196 lb 3.2 oz (89 kg)               Abdomen: Soft.  Non-tender.  No masses.  No HSM.  Incision/s: Intact.  Healing well.  No erythema.  No drainage.  Assessment:    [id number] Patient Active Problem List   Diagnosis Date Noted   Labor and delivery, indication for care 12/11/2022   Obesity affecting pregnancy in third trimester 11/21/2022   Asthma 06/07/2022   Obesity (BMI  35.0-39.9 without comorbidity) 06/07/2022   Pap smear cannot exclude high grade squamous intraepithelial lesion (ASC-H) 06/07/2022   Supervision of other normal pregnancy, antepartum 04/22/2022   PCOS (polycystic ovarian syndrome) 11/21/2021   Menometrorrhagia 07/05/2019     1. Postoperative state   2. Postpartum care following cesarean delivery   3. Breastfeeding problem     Generally doing well postop.   Would like to meet with lactation consultation for help with the baby's latch.   Plan:            1.  Wound care discussed.-Remove dressing.  2.  Lactation consultation. Orders Orders Placed This Encounter  Procedures   Ambulatory referral to Lactation    No orders of the defined types were placed in this encounter.     F/U  Return in about 4 weeks (around 01/21/2023).  Elonda Husky, M.D. 12/24/2022 11:43 AM

## 2022-12-24 NOTE — Progress Notes (Signed)
Patient presents today for 2 week postpartum follow-up. Patient had a cesarean delivery on 12/11/22.  Mom is breast feeding and pumping, would like lactation referral due to latch issues. She states she would like the patch for birth control. EPDS score of 5. She states no other questions or concerns at this time.

## 2022-12-25 ENCOUNTER — Ambulatory Visit: Payer: Self-pay

## 2022-12-25 NOTE — Lactation Note (Signed)
This note was copied from a baby's chart. Lactation Consultation Note  Patient Name: Tina Gutierrez [id number] Date: 12/25/2022 Age:30 wk.o.   Lactation met w/ 2 week old Tina Gutierrez and her mom.  MOB is concerned that infant keeps coming off the breast during a feed. Infant was born on 12/[birth year]/22 and weighed 5lbs 4oz. Infants last pediatrician appointment is 12/24/22.  Per mom she pumps about 3-4oz per breast. MOB states that there are times when infant will fall asleep at the breast. Infant normally eat at 12, 3:30, 6 or 6:30, 9 or 9:30 and so on.  Mom stated that infant does wake up on her own but every now and then she may have to wake her. There have been times where infant will eat at breast for or longer.   Infant went to the left breast in cradle in hold and latched easily.  LC made minor adjustments by adjusting infants posture w/ extra pillows.  Infants lips were flanged out w/ audible swallows heard. In the beginning of the feed infant was very vigorous and slowed down after 10 minutes.  MOB burped infant and she was reweighed.  Infant was then put to the rt breast in football hold.  Infant latched suckled for a couple of minutes and came off. She relatched, audible swallows were heard then she pulled off with milk spilling down her cheek.  MOB stated that when she feels the let down that is when she notices infant comes off the breast.  LC placed infant and mom in laid back position w/ infant semi-upward so the let down wasn't so strong.  Infant was able to last at the breast a little longer. But like the left breast around 10 minutes, infant slows down as if tired.    Infant burped and spit up some of the milk.  After infant was reweighed, mom pace bottle feed infant in side lying position.  Infant only took in 20ml of the milk.  In total w/ breast/bottle infant took in 88ml of milk.    After observation of a feeding LC observed that infant can latch on to breast well.  Mom has  a plentiful supply of milk but at a breastfeeding session the let downs may be to strong for infant.  Infant only took in 26ml on the left breast and 42ml at the rt breast.  According to mom infant normally eats about 3oz at the bottle. Based off of this visit, infant is not taking in enough at the breast and is tiring out quickly.    Feeding plan; LC is recommending that MOB offering breast every other feeding. When infant is at the breast, she is limited to only 15 minutes unless she tires out before this.  Then follow up with EBM. When infant doesn't go to the breast, mom is to just off EBM in a bottle, pace bottle feeding her.  Mom verbalized understanding.  Mom will also need to record all feeding sessions as well as number of wet/dirty diapers.  Infant should be getting at least 8 feedings in a 24hr period.   Lactation would like mom to come back in a week to check weight and see how the feeding went.   Starting weight - 2674grams  Ending weight - 2766grams  Total amt of milk taken in 88ml  20ml of that from bottle (Dr. Theora Gianotti)       Tina Gutierrez 12/25/2022, 2:43 PM

## 2023-01-22 ENCOUNTER — Ambulatory Visit: Payer: Medicaid Other | Admitting: Obstetrics and Gynecology

## 2023-01-22 ENCOUNTER — Encounter: Payer: Self-pay | Admitting: Obstetrics and Gynecology

## 2023-01-22 VITALS — BP 107/71 | HR 67 | Ht 61.0 in | Wt 193.9 lb

## 2023-01-22 DIAGNOSIS — Z9889 Other specified postprocedural states: Secondary | ICD-10-CM

## 2023-01-22 MED ORDER — NORETHINDRONE 0.35 MG PO TABS: 1.0000 | ORAL_TABLET | Freq: Every day | ORAL | 2 refills | Status: DC

## 2023-01-22 NOTE — Progress Notes (Signed)
Patient presents today for 6 week postpartum follow-up. Patient had a cesarean delivery on 12/11/22.  She is breast feeding and pumping, doing better after seeing lactation. She states she would like the minipill for birth control. EPDS score of 8, states no concerns and that she is just tired. She states no other questions or concerns at this time.

## 2023-01-22 NOTE — Progress Notes (Signed)
HPI:      [name] is a 32 y.o. [phone] who LMP was No LMP recorded.  Subjective:   She presents today 6 weeks postop and postpartum from cesarean delivery.  She reports that her breast-feeding has improved since seeing lactation consultant.  She is breast-feeding full-time.  She is ambulating voiding and having bowel movements without issue.  She has no concerns today.  She would like to start OCPs for birth control.  She would like a birth control pill that has no potential for affecting breastmilk.    Hx: The following portions of the patient's history were reviewed and updated as appropriate:             She  has a past medical history of Abnormal uterine bleeding (06/27/2017), Anemia, Ovarian cyst, and Symptomatic anemia. She does not have any pertinent problems on file. She  has a past surgical history that includes Cholecystectomy (01/2012) and Cesarean section (12/11/2022). Her family history includes Alzheimer's disease in her paternal grandfather; Dementia in her paternal grandfather; Diabetes in her maternal grandfather; Healthy in her brother, brother, father, maternal grandfather, maternal grandmother, mother, and paternal grandmother. She  reports that she has never smoked. She has never used smokeless tobacco. She reports that she does not drink alcohol and does not use drugs. She has a current medication list which includes the following prescription(s): fluticasone, ibuprofen, norethindrone, and multivitamin-prenatal. She is allergic to other.       Review of Systems:  Review of Systems  Constitutional: Denied constitutional symptoms, night sweats, recent illness, fatigue, fever, insomnia and weight loss.  Eyes: Denied eye symptoms, eye pain, photophobia, vision change and visual disturbance.  Ears/Nose/Throat/Neck: Denied ear, nose, throat or neck symptoms, hearing loss, nasal discharge, sinus congestion and sore throat.  Cardiovascular: Denied cardiovascular  symptoms, arrhythmia, chest pain/pressure, edema, exercise intolerance, orthopnea and palpitations.  Respiratory: Denied pulmonary symptoms, asthma, pleuritic pain, productive sputum, cough, dyspnea and wheezing.  Gastrointestinal: Denied, gastro-esophageal reflux, melena, nausea and vomiting.  Genitourinary: Denied genitourinary symptoms including symptomatic vaginal discharge, pelvic relaxation issues, and urinary complaints.  Musculoskeletal: Denied musculoskeletal symptoms, stiffness, swelling, muscle weakness and myalgia.  Dermatologic: Denied dermatology symptoms, rash and scar.  Neurologic: Denied neurology symptoms, dizziness, headache, neck pain and syncope.  Psychiatric: Denied psychiatric symptoms, anxiety and depression.  Endocrine: Denied endocrine symptoms including hot flashes and night sweats.   Meds:   Current Outpatient Medications on File Prior to Visit  Medication Sig Dispense Refill   fluticasone (FLONASE) 50 MCG/ACT nasal spray Place into both nostrils as needed.     ibuprofen (ADVIL) 600 MG tablet Take 1 tablet (600 mg total) by mouth every 6 (six) hours.     Prenatal Vit-Fe Fumarate-FA (MULTIVITAMIN-PRENATAL) 27-0.8 MG TABS tablet Take 2 tablets by mouth daily at 12 noon.     No current facility-administered medications on file prior to visit.      Objective:     Vitals:   01/22/23 0839  BP: 107/71  Pulse: 67   Filed Weights   01/22/23 0839  Weight: 193 lb 14.4 oz (88 kg)               Abdomen: Soft.  Non-tender.  No masses.  No HSM.  Incision/s: Intact.  Healing well.  No erythema.  No drainage.             Assessment:    [id number] Patient Active Problem List   Diagnosis Date Noted   Labor and delivery,  indication for care 12/11/2022   Obesity affecting pregnancy in third trimester 11/21/2022   Asthma 06/07/2022   Obesity (BMI 35.0-39.9 without comorbidity) 06/07/2022   Pap smear cannot exclude high grade squamous intraepithelial lesion  (ASC-H) 06/07/2022   Supervision of other normal pregnancy, antepartum 04/22/2022   PCOS (polycystic ovarian syndrome) 11/21/2021   Menometrorrhagia 07/05/2019     1. Postoperative state   2. Postpartum care following cesarean delivery     Patient with excellent recovery postop.  Desires birth control.   Plan:            1.  Discussed multiple methods of birth control.  She has Brewing technologist. Breastfeeding and Birth Control Breastfeeding and birth control were discussed in detail.  The patient understands that breastfeeding is not a reliable form of birth control.  We have discussed the use of Progesterone-only birth control pills and combination OCPs.  I have informed her that Progesterone only pills are safe with breastfeeding and very effective when used in combination with full-time breastfeeding.  I have stressed that when she begins to wean from breastfeeding, Progesterone-only pills become less effective and switching to another form of birth control is recommended.  We have also discussed the use of combination OCPs with breastfeeding.  I have informed her that OCPs are safe with breastfeeding even though a small amount of the drug is carried in breast milk.  We have discussed the fact that combination OCPs can reduce the amount of breast milk produced.  I have made her aware that in rare instances this can lead to discontinuation of breastfeeding.  The use of Mirena IUD was discussed and she has been made aware that this is safe with breastfeeding.  All her questions were answered and she was given appropriate literature on birth control methods.  Orders No orders of the defined types were placed in this encounter.    Meds ordered this encounter  Medications   norethindrone (MICRONOR) 0.35 MG tablet    Sig: Take 1 tablet (0.35 mg total) by mouth daily.    Dispense:  84 tablet    Refill:  2      F/U  Return in about 3 months (around 04/22/2023) for Annual Physical.  Elonda Husky, M.D. 01/22/2023 9:30 AM

## 2023-11-03 ENCOUNTER — Other Ambulatory Visit: Payer: Self-pay

## 2023-11-03 ENCOUNTER — Encounter: Payer: Self-pay | Admitting: Emergency Medicine

## 2023-11-03 DIAGNOSIS — J45909 Unspecified asthma, uncomplicated: Secondary | ICD-10-CM | POA: Insufficient documentation

## 2023-11-03 DIAGNOSIS — N751 Abscess of Bartholin's gland: Secondary | ICD-10-CM | POA: Insufficient documentation

## 2023-11-03 NOTE — ED Triage Notes (Signed)
 Pt presents to the ED via POV with complaints of vaginal abscess x 2 days. She notes that the pain has gotten significantly worse over the last day - tried Tylenol  without any improvement. A&Ox4 at this time. Denies fevers, chills, drainage, CP or SOB.

## 2023-11-04 ENCOUNTER — Emergency Department
Admission: EM | Admit: 2023-11-04 | Discharge: 2023-11-04 | Disposition: A | Discharge status: Home / Self Care | Attending: Emergency Medicine | Admitting: Emergency Medicine

## 2023-11-04 DIAGNOSIS — N751 Abscess of Bartholin's gland: Secondary | ICD-10-CM

## 2023-11-04 NOTE — ED Provider Notes (Signed)
 [facility] Provider Note    Event Date/Time   First MD Initiated Contact with Patient 11/04/23 0105     (approximate)   History   Chief Complaint Abscess   HPI  Tina Gutierrez is a 33 y.o. female with past medical history of asthma and anemia presents to the ED complaining of abscess.  Patient reports that she has had about 24 hours of painful swelling around her left labia.  She states that she has dealt with this once previously, required incision and drainage of abscess to the same area.  She went to wipe after urinating in the waiting room, now states that it feels like it may have popped and she has had some drainage of pus and blood.  She denies any fevers and has not had any abdominal pain, nausea, or vomiting.     Physical Exam   Triage Vital Signs: ED Triage Vitals  Encounter Vitals Group     BP 11/03/23 2159 112/77     Girls Systolic BP Percentile --      Girls Diastolic BP Percentile --      Boys Systolic BP Percentile --      Boys Diastolic BP Percentile --      Pulse Rate 11/03/23 2159 100     Resp 11/03/23 2159 18     Temp 11/03/23 2159 98.4 F (36.9 C)     Temp Source 11/03/23 2159 Oral     SpO2 11/03/23 2159 100 %     Weight 11/03/23 2200 185 lb (83.9 kg)     Height 11/03/23 2200 5' 1 (1.549 m)     Head Circumference --      Peak Flow --      Pain Score 11/03/23 2159 8     Pain Loc --      Pain Education --      Exclude from Growth Chart --     Most recent vital signs: Vitals:   11/03/23 2159 11/04/23 0127  BP: 112/77   Pulse: 100   Resp: 18   Temp: 98.4 F (36.9 C)   SpO2: 100% 100%    Constitutional: Alert and oriented. Eyes: Conjunctivae are normal. Head: Atraumatic. Nose: No congestion/rhinnorhea. Mouth/Throat: Mucous membranes are moist.  Cardiovascular: Normal rate, regular rhythm. Grossly normal heart sounds.  2+ radial pulses bilaterally. Respiratory: Normal respiratory effort.  No retractions.  Lungs CTAB. Gastrointestinal: Soft and nontender. No distention. Genitourinary: Small Bartholin's gland abscess on the left with purulent drainage. Musculoskeletal: No lower extremity tenderness nor edema.  Neurologic:  Normal speech and language. No gross focal neurologic deficits are appreciated.    ED Results / Procedures / Treatments   Labs (all labs ordered are listed, but only abnormal results are displayed) Labs Reviewed - No data to display   PROCEDURES:  Critical Care performed: No  Procedures   MEDICATIONS ORDERED IN ED: Medications - No data to display   IMPRESSION / MDM / ASSESSMENT AND PLAN / ED COURSE  I reviewed the triage vital signs and the nursing notes.                              33 y.o. female with a past medical history of asthma and anemia who presents to the ED complaining of pain and swelling to her left labia over the past 24 hours.  Patient's presentation is most consistent with acute complicated illness / injury requiring  diagnostic workup.  Differential diagnosis includes, but is not limited to, Bartholin's abscess, labial abscess, cellulitis.  Patient nontoxic-appearing and in no acute distress, vital signs are unremarkable.  She has a left Bartholin's gland abscess that appears to be spontaneously drainage, no need for further incision and drainage at this time.  Patient appropriate for outpatient management, do not feel antibiotics indicated as there is no significant surrounding cellulitis.  Patient counseled on warm compresses and counseled to follow-up with her OB/GYN, otherwise return to the ED for new or worsening symptoms.  Patient agrees with plan.      FINAL CLINICAL IMPRESSION(S) / ED DIAGNOSES   Final diagnoses:  Bartholin's gland abscess     Rx / DC Orders   ED Discharge Orders     None        Note:  This document was prepared using Dragon voice recognition software and may include unintentional dictation errors.    Willo Dunnings, MD 11/04/23 0130

## 2024-01-19 ENCOUNTER — Ambulatory Visit

## 2024-01-19 VITALS — BP 123/76 | HR 88 | Ht 61.0 in | Wt 187.0 lb

## 2024-01-19 DIAGNOSIS — Z3491 Encounter for supervision of normal pregnancy, unspecified, first trimester: Secondary | ICD-10-CM

## 2024-01-19 DIAGNOSIS — N926 Irregular menstruation, unspecified: Secondary | ICD-10-CM

## 2024-01-19 LAB — POCT URINE PREGNANCY: Preg Test, Ur: POSITIVE — AB

## 2024-01-19 NOTE — Progress Notes (Signed)
    NURSE VISIT NOTE  Subjective:    Patient ID: Tina Gutierrez, female    DOB: [birth year], 33 y.o.   MRN: [id number]  HPI  Patient is a 33 y.o. [id number] female who presents for evaluation of amenorrhea. She believes she could be pregnant. Pregnancy is desired. Sexual Activity: single partner, contraception: none. Current symptoms also include: fatigue, positive home pregnancy test, and dizziness. Last period was abnormal.    Objective:    BP 123/76   Pulse 88   Ht 5' 1 (1.549 m)   Wt 187 lb (84.8 kg)   LMP 12/12/2023   BMI 35.33 kg/m   Lab Review  Results for orders placed or performed in visit on 01/19/24  POCT urine pregnancy  Result Value Ref Range   Preg Test, Ur Positive (A) Negative    Assessment:   1. Missed period   2. Pregnancy with uncertain dates in first trimester     Plan:   Pregnancy Test: Positive  Estimated Date of Delivery: 09/17/24 BP Cuff Measurement taken. Cuff Size Adult Large Encouraged well-balanced diet, plenty of rest when needed, pre-natal vitamins daily and walking for exercise.  Discussed self-help for nausea, avoiding OTC medications until consulting provider or pharmacist, other than Tylenol  as needed, minimal caffeine  (1-2 cups daily) and avoiding alcohol.   She will schedule her nurse visit @ 7-[redacted] wks pregnant, u/s for dating @10  wk, and NOB visit at [redacted] wk pregnant.    Feel free to call with any questions.     Harlene Gander, CMA

## 2024-02-02 ENCOUNTER — Telehealth (INDEPENDENT_AMBULATORY_CARE_PROVIDER_SITE_OTHER)

## 2024-02-02 DIAGNOSIS — O0990 Supervision of high risk pregnancy, unspecified, unspecified trimester: Secondary | ICD-10-CM | POA: Insufficient documentation

## 2024-02-02 DIAGNOSIS — Z98891 History of uterine scar from previous surgery: Secondary | ICD-10-CM | POA: Insufficient documentation

## 2024-02-02 DIAGNOSIS — Z3689 Encounter for other specified antenatal screening: Secondary | ICD-10-CM

## 2024-02-02 DIAGNOSIS — Z3480 Encounter for supervision of other normal pregnancy, unspecified trimester: Secondary | ICD-10-CM | POA: Insufficient documentation

## 2024-02-02 NOTE — Progress Notes (Signed)
 New OB Intake  I connected with  Tina Gutierrez on 02/02/2024 at 11:15 AM EST by MyChart Video Visit and verified that I am speaking with the correct person using two identifiers. Nurse is located at [facility] and pt is located at home.  I discussed the limitations, risks, security and privacy concerns of performing an evaluation and management service by telephone and the availability of in person appointments. I also discussed with the patient that there may be a patient responsible charge related to this service. The patient expressed understanding and agreed to proceed.  I explained I am completing New OB Intake today. We discussed her EDD of 09/17/24 that is based on LMP of 12/12/23. Pt is G4/P1. I reviewed her allergies, medications, Medical/Surgical/OB history, and appropriate screenings. There are cats in the home: no. Based on history, this is a/an pregnancy uncomplicated . Her obstetrical history is significant for N/A.  Patient Active Problem List   Diagnosis Date Noted   Supervision of other normal pregnancy, antepartum 02/02/2024   History of cesarean section 02/02/2024   Asthma 06/07/2022   Obesity (BMI 35.0-39.9 without comorbidity) 06/07/2022   Pap smear cannot exclude high grade squamous intraepithelial lesion (ASC-H) 06/07/2022   PCOS (polycystic ovarian syndrome) 11/21/2021   Menometrorrhagia 07/05/2019    Concerns addressed today: None  Delivery Plans:  Plans to deliver at [facility].  Anatomy US  Explained first scheduled US  will be 02/23/24. Anatomy US  will be scheduled around [redacted] weeks gestational age.  Labs Discussed genetic screening with patient. Patient desires genetic testing to be drawn at new OB visit. Discussed possible labs to be drawn at new OB appointment.  COVID Vaccine Patient has had COVID vaccine.   Social Determinants of Health Food Insecurity: denies food insecurity  Transportation: Patient denies transportation  needs. Childcare: Discussed no children allowed at ultrasound appointments.   First visit review I reviewed new OB appt with pt. I explained she will have blood work and pap smear/pelvic exam if indicated. Explained pt will be seen by Jinnie Cookey, CNM at first visit; encounter routed to appropriate provider.   Beola Skeens, CMA 02/02/2024  11:31 AM

## 2024-02-02 NOTE — Patient Instructions (Signed)
 First Trimester of Pregnancy  The first trimester of pregnancy starts on the first day of your last monthly period until the end of week 13. This is months 1 through 3 of pregnancy. A week after a sperm fertilizes an egg, the egg will implant into the wall of the uterus and begin to develop into a baby. Body changes during your first trimester Your body goes through many changes during pregnancy. The changes usually return to normal after your baby is born. Physical changes Your breasts may grow larger and may hurt. The area around your nipples may get darker. Your periods will stop. Your hair and nails may grow faster. You may pee more often. Health changes You may tire easily. Your gums may bleed and may be sensitive when you brush and floss. You may not feel hungry. You may have heartburn. You may throw up or feel like you may throw up. You may want to eat some foods, but not others. You may have headaches. You may have trouble pooping (constipation). Other changes Your emotions may change from day to day. You may have more dreams. Follow these instructions at home: Medicines Talk to your health care provider if you're taking medicines. Ask if the medicines are safe to take during pregnancy. Your provider may change the medicines that you take. Do not take any medicines unless told to by your provider. Take a prenatal vitamin that has at least 600 micrograms (mcg) of folic acid. Do not use herbal medicines, illegal substances, or medicines that are not approved by your provider. Eating and drinking While you're pregnant your body needs extra food for your growing baby. Talk with your provider about what to eat while pregnant. Activity Most women are able to exercise during pregnancy. Exercises may need to change as your pregnancy goes on. Talk to your provider about your activities and exercise routines. Relieving pain and discomfort Wear a good, supportive bra if your breasts  hurt. Rest with your legs raised if you have leg cramps or low back pain. Safety Wear your seatbelt at all times when you're in a car. Talk to your provider if someone hits you, hurts you, or yells at you. Talk with your provider if you're feeling sad or have thoughts of hurting yourself. Lifestyle Certain things can be harmful while you're pregnant. Follow these rules: Do not use hot tubs, steam rooms, or saunas. Do not douche. Do not use tampons or scented pads. Do not drink alcohol,smoke, vape, or use products with nicotine or tobacco in them. If you need help quitting, talk with your provider. Avoid cat litter boxes and soil used by cats. These things carry germs that can cause harm to your pregnancy and your baby. General instructions Keep all follow-up visits. It helps you and your unborn baby stay as healthy as possible. Write down your questions. Take them to your visits. Your provider will: Talk with you about your overall health. Give you advice or refer you to specialists who can help with different needs, including: Prenatal education classes. Mental health and counseling. Foods and healthy eating. Ask for help if you need help with food. Call your dentist and ask to be seen. Brush your teeth with a soft toothbrush. Floss gently. Where to find more information American Pregnancy Association: americanpregnancy.org [employer]ians [employer]necologists: acog.org Office on [employer] Health: [employer] Contact a health care provider if: You feel dizzy, faint, or have a fever. You vomit or have watery poop (diarrhea) for 2  days or more. You have abnormal discharge or bleeding from your vagina. You have pain when you pee or your pee smells bad. You have cramps, pain, or pressure in your belly area. Get help right away if: You have trouble breathing or chest pain. You have any kind of injury, such as from a fall or a car crash. These symptoms may be an  emergency. Get help right away. Call 911. Do not wait to see if the symptoms will go away. Do not drive yourself to the hospital. This information is not intended to replace advice given to you by your health care provider. Make sure you discuss any questions you have with your health care provider. Document Revised: 10/31/2022 Document Reviewed: 05/31/2022 Elsevier Patient Education  2024 Elsevier Inc.   Common Medications Safe in Pregnancy  Acne:      Constipation:  Benzoyl Peroxide     Colace  Clindamycin      Dulcolax Suppository  Topica Erythromycin     Fibercon  Salicylic Acid      Metamucil         Miralax AVOID:        Senakot   Accutane    Cough:  Retin-A       Cough Drops  Tetracycline      Phenergan w/ Codeine if Rx  Minocycline      Robitussin (Plain & DM)  Antibiotics:     Crabs/Lice:  Ceclor       RID  Cephalosporins    AVOID:  E-Mycins      Kwell  Keflex  Macrobid/Macrodantin   Diarrhea:  Penicillin      Kao-Pectate  Zithromax      Imodium AD         PUSH FLUIDS AVOID:       Cipro     Fever:  Tetracycline      Tylenol (Regular or Extra  Minocycline       Strength)  Levaquin      Extra Strength-Do not          Exceed 8 tabs/24 hrs Caffeine:        200mg /day (equiv. To 1 cup of coffee or  approx. 3 12 oz sodas)         Gas: Cold/Hayfever:       Gas-X  Benadryl      Mylicon  Claritin       Phazyme  **Claritin-D        Chlor-Trimeton    Headaches:  Dimetapp      ASA-Free Excedrin  Drixoral-Non-Drowsy     Cold Compress  Mucinex (Guaifenasin)     Tylenol (Regular or Extra  Sudafed/Sudafed-12 Hour     Strength)  **Sudafed PE Pseudoephedrine   Tylenol Cold & Sinus     Vicks Vapor Rub  Zyrtec  **AVOID if Problems With Blood Pressure         Heartburn: Avoid lying down for at least 1 hour after meals  Aciphex      Maalox     Rash:  Milk of Magnesia     Benadryl    Mylanta       1% Hydrocortisone Cream  Pepcid  Pepcid Complete   Sleep  Aids:  Prevacid      Ambien   Prilosec       Benadryl  Rolaids       Chamomile Tea  Tums (Limit 4/day)     Unisom  Tylenol PM         Warm milk-add vanilla or  Hemorrhoids:       Sugar for taste  Anusol/Anusol H.C.  (RX: Analapram 2.5%)  Sugar Substitutes:  Hydrocortisone OTC     Ok in moderation  Preparation H      Tucks        Vaseline lotion applied to tissue with wiping    Herpes:     Throat:  Acyclovir      Oragel  Famvir  Valtrex     Vaccines:         Flu Shot Leg Cramps:       *Gardasil  Benadryl      Hepatitis A         Hepatitis B Nasal Spray:       Pneumovax  Saline Nasal Spray     Polio Booster         Tetanus Nausea:       Tuberculosis test or PPD  Vitamin B6 25 mg TID   AVOID:    Dramamine      *Gardasil  Emetrol       Live Poliovirus  Ginger Root 250 mg QID    MMR (measles, mumps &  High Complex Carbs @ Bedtime    rebella)  Sea Bands-Accupressure    Varicella (Chickenpox)  Unisom 1/2 tab TID     *No known complications           If received before Pain:         Known pregnancy;   Darvocet       Resume series after  Lortab        Delivery  Percocet    Yeast:   Tramadol      Femstat  Tylenol 3      Gyne-lotrimin  Ultram       Monistat  Vicodin           MISC:         All Sunscreens           Hair Coloring/highlights          Insect Repellant's          (Including DEET)         Mystic Tans   Commonly Asked Questions During Pregnancy   Cats: A parasite can be excreted in cat feces.  To avoid exposure you need to have another person empty the little box.  If you must empty the litter box you will need to wear gloves.  Wash your hands after handling your cat.  This parasite can also be found in raw or undercooked meat so this should also be avoided.  Colds, Sore Throats, Flu: Please check your medication sheet to see what you can take for symptoms.  If your symptoms are unrelieved by these medications please call the office.  Dental Work: Most  any dental work Agricultural consultant recommends is permitted.  X-rays should only be taken during the first trimester if absolutely necessary.  Your abdomen should be shielded with a lead apron during all x-rays.  Please notify your provider prior to receiving any x-rays.  Novocaine is fine; gas is not recommended.  If your dentist requires a note from [place] prior to dental work please call the office and we will provide one for you.  Exercise: Exercise is an important part of staying healthy during your pregnancy.  You may continue most exercises you were accustomed to prior to pregnancy.  Later in your pregnancy you will most likely notice you have difficulty with activities requiring balance like riding a bicycle.  It is important that you listen to your body and avoid activities that put you at a higher risk of falling.  Adequate rest and staying well hydrated are a must!  If you have questions about the safety of specific activities ask your provider.    Exposure to Children with illness: Try to avoid obvious exposure; report any symptoms to [place] when noted,  If you have chicken pos, red measles or mumps, you should be immune to these diseases.   Please do not take any vaccines while pregnant unless you have checked with your OB provider.  Fetal Movement: After 28 weeks we recommend you do "kick counts" twice daily.  Lie or sit down in a calm quiet environment and count your baby movements "kicks".  You should feel your baby at least 10 times per hour.  If you have not felt 10 kicks within the first hour get up, walk around and have something sweet to eat or drink then repeat for an additional hour.  If count remains less than 10 per hour notify your provider.  Fumigating: Follow your pest control agent's advice as to how long to stay out of your home.  Ventilate the area well before re-entering.  Hemorrhoids:   Most over-the-counter preparations can be used during pregnancy.  Check your medication to see what is  safe to use.  It is important to use a stool softener or fiber in your diet and to drink lots of liquids.  If hemorrhoids seem to be getting worse please call the office.   Hot Tubs:  Hot tubs Jacuzzis and saunas are not recommended while pregnant.  These increase your internal body temperature and should be avoided.  Intercourse:  Sexual intercourse is safe during pregnancy as long as you are comfortable, unless otherwise advised by your provider.  Spotting may occur after intercourse; report any bright red bleeding that is heavier than spotting.  Labor:  If you know that you are in labor, please go to the hospital.  If you are unsure, please call the office and let us help you decide what to do.  Lifting, straining, etc:  If your job requires heavy lifting or straining please check with your provider for any limitations.  Generally, you should not lift items heavier than that you can lift simply with your hands and arms (no back muscles)  Painting:  Paint fumes do not harm your pregnancy, but may make you ill and should be avoided if possible.  Latex or water based paints have less odor than oils.  Use adequate ventilation while painting.  Permanents & Hair Color:  Chemicals in hair dyes are not recommended as they cause increase hair dryness which can increase hair loss during pregnancy.  " Highlighting" and permanents are allowed.  Dye may be absorbed differently and permanents may not hold as well during pregnancy.  Sunbathing:  Use a sunscreen, as skin burns easily during pregnancy.  Drink plenty of fluids; avoid over heating.  Tanning Beds:  Because their possible side effects are still unknown, tanning beds are not recommended.  Ultrasound Scans:  Routine ultrasounds are performed at approximately 20 weeks.  You will be able to see your baby's general anatomy an if you would like to know the gender this can usually be determined as well.  If it is questionable when you conceived you may  also  receive an ultrasound early in your pregnancy for dating purposes.  Otherwise ultrasound exams are not routinely performed unless there is a medical necessity.  Although you can request a scan we ask that you pay for it when conducted because insurance does not cover " patient request" scans.  Work: If your pregnancy proceeds without complications you may work until your due date, unless your physician or employer advises otherwise.  Round Ligament Pain/Pelvic Discomfort:  Sharp, shooting pains not associated with bleeding are fairly common, usually occurring in the second trimester of pregnancy.  They tend to be worse when standing up or when you remain standing for long periods of time.  These are the result of pressure of certain pelvic ligaments called "round ligaments".  Rest, Tylenol and heat seem to be the most effective relief.  As the womb and fetus grow, they rise out of the pelvis and the discomfort improves.  Please notify the office if your pain seems different than that described.  It may represent a more serious condition.

## 2024-02-23 ENCOUNTER — Ambulatory Visit (INDEPENDENT_AMBULATORY_CARE_PROVIDER_SITE_OTHER)

## 2024-02-23 DIAGNOSIS — Z3A09 9 weeks gestation of pregnancy: Secondary | ICD-10-CM | POA: Diagnosis not present

## 2024-02-23 DIAGNOSIS — N926 Irregular menstruation, unspecified: Secondary | ICD-10-CM

## 2024-02-23 DIAGNOSIS — Z3491 Encounter for supervision of normal pregnancy, unspecified, first trimester: Secondary | ICD-10-CM | POA: Diagnosis not present

## 2024-03-08 ENCOUNTER — Encounter: Admitting: Licensed Practical Nurse

## 2024-03-12 NOTE — Progress Notes (Unsigned)
 NEW OB HISTORY AND PHYSICAL  SUBJECTIVE:       Tina Gutierrez is a 34 y.o. [phone] female, Patient's last menstrual period was 12/12/2023 (approximate)., Estimated Date of Delivery: 09/27/24, [redacted]w[redacted]d, presents today for establishment of Prenatal Care. She reports {:[id number]}   Social history Partner/Relationship: Living situation: Work: Exercise: Substance use:  Indications for ASA therapy (per uptodate) One of the following: Previous pregnancy with preeclampsia, especially early onset and with an adverse outcome {yes/no:20286} Multifetal gestation {yes/no:20286} Chronic hypertension {yes/no:20286} Type 1 or 2 diabetes mellitus {yes/no:20286} Chronic kidney disease {yes/no:20286} Autoimmune disease (antiphospholipid syndrome, systemic lupus erythematosus) {yes/no:20286}  Two or more of the following: Nulliparity {yes/no:20286} Obesity (body mass index >30 kg/m2) {yes/no:20286} Family history of preeclampsia in mother or sister {yes/no:20286} Age >=35 years {yes/no:20286} Sociodemographic characteristics (African American race, low socioeconomic level) {yes/no:20286} Personal risk factors (eg, previous pregnancy with low birth weight or small for gestational age infant, previous adverse pregnancy outcome [eg, stillbirth], interval >10 years between pregnancies) {yes/no:20286}  Indications for early GDM screening  First-degree relative with diabetes {yes/no:20286} BMI >30kg/m2 {yes/no:20286} Age > 35 {yes/no:20286} Previous birth of an infant weighing >=4000 g {yes/no:20286} Gestational diabetes mellitus in a previous pregnancy {yes/no:20286} Glycated hemoglobin >=5.7 percent (39 mmol/mol), impaired glucose tolerance, or impaired fasting glucose on previous testing {yes/no:20286} High-risk race/ethnicity (eg, African American, Latino, Native American, Asian American, Pacific Islander) {yes/no:20286} Previous stillbirth of unknown cause {yes/no:20286} Maternal birthweight > 9 lbs  {yes/no:20286} History of cardiovascular disease {yes/no:20286} Hypertension or on therapy for hypertension {yes/no:20286} High-density lipoprotein cholesterol level <35 mg/dL (9.09 mmol/L) and/or a triglyceride level >250 mg/dL (7.17 mmol/L) {bzd/wn:79713} Polycystic ovary syndrome {yes/no:20286} Physical inactivity {yes/no:20286} Other clinical condition associated with insulin resistance (eg, severe obesity, acanthosis nigricans) {yes/no:20286} Current use of glucocorticoids {yes/no:20286}   Early screening tests: FBS, A1C, Random CBG, glucose challenge  Gynecologic History Patient's last menstrual period was 12/12/2023 (approximate). {Blank multiple:19196::Normal,Abnormal,Unknown} Contraception: {method:5051} Last Pap:     Component Value Date/Time   DIAGPAP (A) 06/07/2022 1535    - Atypical squamous cells, cannot exclude high grade squamous   DIAGPAP intraepithelial lesion (ASC-H) (A) 06/07/2022 1535   DIAGPAP  12/24/2019 1554    - Negative for intraepithelial lesion or malignancy (NILM)   HPVHIGH Negative 06/07/2022 1535   ADEQPAP  06/07/2022 1535    Satisfactory for evaluation; transformation zone component PRESENT.   ADEQPAP  12/24/2019 1554    Satisfactory for evaluation; transformation zone component PRESENT.  SABRA Results were: {norm/abn:16337}  Obstetric History OB History  Gravida Para Term Preterm AB Living  4 1 1  0 2 1  SAB IAB Ectopic Multiple Live Births  2 0 0 0 1    # Outcome Date GA Lbr Len/2nd Weight Sex Type Anes PTL Lv  4 Current           3 Term 12/11/22 [redacted]w[redacted]d  5 lb 4 oz (2.38 kg) F CS-LTranv Spinal  LIV  2 SAB 02/2022     SAB        Birth Comments: chemical  1 SAB 2012     SAB       Past Medical History:  Diagnosis Date   Abnormal uterine bleeding 06/27/2017   Anemia    Ovarian cyst    Symptomatic anemia     Past Surgical History:  Procedure Laterality Date   CESAREAN SECTION  12/11/2022   Procedure: CESAREAN SECTION;  Surgeon:  Janit Alm Agent, MD;  Location: [facility] ORS;  Service: Obstetrics;;   CHOLECYSTECTOMY  01/2012   [facility]    Medications Ordered Prior to Encounter[1]  Allergies[2]  Social History   Socioeconomic History   Marital status: Significant Other    Spouse name: Not on file   Number of children: 1   Years of education: 14   Highest education level: Some college, no degree  Occupational History   Occupation: Stay at [employer]  Tobacco Use   Smoking status: Never   Smokeless tobacco: Never  Vaping Use   Vaping status: Never Used  Substance and Sexual Activity   Alcohol use: No   Drug use: Never   Sexual activity: Yes    Partners: Male    Birth control/protection: None  Other Topics Concern   Not on file  Social History Narrative   [place]   Social Drivers of Health   Tobacco Use: Low Risk (02/02/2024)   Patient History    Smoking Tobacco Use: Never    Smokeless Tobacco Use: Never    Passive Exposure: Not on file  Financial Resource Strain: Low Risk (02/02/2024)   Overall Financial Resource Strain (CARDIA)    Difficulty of Paying Living Expenses: Not very hard  Food Insecurity: No Food Insecurity (02/02/2024)   Epic    Worried About Radiation Protection Practitioner of Food in the Last Year: Never true    Ran Out of Food in the Last Year: Never true  Transportation Needs: No Transportation Needs (02/02/2024)   Epic    Lack of Transportation (Medical): No    Lack of Transportation (Non-Medical): No  Physical Activity: Unknown (02/02/2024)   Exercise Vital Sign    Days of Exercise per Week: Patient declined    Minutes of Exercise per Session: Not on file  Stress: No Stress Concern Present (02/02/2024)   [employer] of Occupational Health - Occupational Stress Questionnaire    Feeling of Stress: Only a little  Social Connections: Moderately Integrated (02/02/2024)   Social Connection and Isolation Panel    Frequency of Communication with Friends and Family: Twice a week    Frequency of  Social Gatherings with Friends and Family: Once a week    Attends Religious Services: 1 to 4 times per year    Active Member of [employer] or Organizations: No    Attends Engineer, Structural: Not on file    Marital Status: Living with partner  Intimate Partner Violence: Not At Risk (02/02/2024)   Epic    Fear of Current or Ex-Partner: No    Emotionally Abused: No    Physically Abused: No    Sexually Abused: No  Depression (PHQ2-9): Not on file  Alcohol Screen: Low Risk (02/02/2024)   Alcohol Screen    Last Alcohol Screening Score (AUDIT): 1  Housing: Low Risk (02/02/2024)   Epic    Unable to Pay for Housing in the Last Year: No    Number of Times Moved in the Last Year: 0    Homeless in the Last Year: No  Recent Concern: Housing - High Risk (02/02/2024)   Epic    Unable to Pay for Housing in the Last Year: Yes    Number of Times Moved in the Last Year: 0    Homeless in the Last Year: No  Utilities: At Risk (02/02/2024)   Epic    Threatened with loss of utilities: Yes  Health Literacy: Adequate Health Literacy (02/02/2024)   B1300 Health Literacy    Frequency of need for help with medical instructions: Never    Family History  Problem Relation Age of Onset   Healthy Mother    Healthy Father    Healthy Brother    Healthy Brother    Healthy Maternal Grandmother    Healthy Maternal Grandfather    Diabetes Maternal Grandfather        TYPE 2   Healthy Paternal Grandmother    Alzheimer's disease Paternal Grandfather    Dementia Paternal Grandfather     The following portions of the patient's history were reviewed and updated as appropriate: allergies, current medications, past OB history, past medical history, past surgical history, past family history, past social history, and problem list.  Constitutional: Denied constitutional symptoms, night sweats, recent illness, fatigue, fever, insomnia and weight loss.  Eyes: Denied eye symptoms, eye pain, photophobia, vision  change and visual disturbance.  Ears/Nose/Throat/Neck: Denied ear, nose, throat or neck symptoms, hearing loss, nasal discharge, sinus congestion and sore throat.  Cardiovascular: Denied cardiovascular symptoms, arrhythmia, chest pain/pressure, edema, exercise intolerance, orthopnea and palpitations.  Respiratory: Denied pulmonary symptoms, asthma, pleuritic pain, productive sputum, cough, dyspnea and wheezing.  Gastrointestinal: Denied gastro-esophageal reflux, melena, nausea and vomiting.  Genitourinary:*** Denied genitourinary symptoms including symptomatic vaginal discharge, pelvic relaxation issues, and urinary complaints.  Musculoskeletal: Denied musculoskeletal symptoms, stiffness, swelling, muscle weakness and myalgia.  Dermatologic: Denied dermatology symptoms, rash and scar.  Neurologic: Denied neurology symptoms, dizziness, headache, neck pain and syncope.  Psychiatric: Denied psychiatric symptoms, anxiety and depression.  Endocrine: Denied endocrine symptoms including hot flashes and night sweats.     OBJECTIVE: Initial Physical Exam (New OB) LMP 12/12/2023 (Approximate)  Physical Exam     ASSESSMENT: {pregnancy state assessment:313271::Normal pregnancy}   PLAN: Routine prenatal care. We discussed an overview of prenatal care and when to call. Reviewed diet, exercise, and weight gain recommendations in pregnancy. Discussed benefits of breastfeeding and lactation resources at [facility]. I reviewed labs and answered all questions.  There are no diagnoses linked to this encounter.  Rollo JINNY Maxin, CMA     [1]  Current Outpatient Medications on File Prior to Visit  Medication Sig Dispense Refill   EPIPEN 2-PAK 0.3 MG/0.3ML SOAJ injection SMARTSIG:1 pre-filled pen syringe IM Once     Ferrous Sulfate (IRON) 325 (65 Fe) MG TABS Take 1 tablet by mouth every other day.     fluticasone (FLONASE) 50 MCG/ACT nasal spray Place into both nostrils as needed.     metFORMIN  (GLUCOPHAGE) 500 MG tablet Take 500 mg by mouth daily. (Patient not taking: Reported on 02/02/2024)     Prenatal Vit-Fe Fumarate-FA (MULTIVITAMIN-PRENATAL) 27-0.8 MG TABS tablet Take 2 tablets by mouth daily at 12 noon.     sertraline (ZOLOFT) 50 MG tablet Take 50 mg by mouth daily. (Patient not taking: Reported on 02/02/2024)     VENTOLIN  HFA 108 (90 Base) MCG/ACT inhaler Inhale into the lungs.     Vitamin D, Ergocalciferol, (DRISDOL) 1.25 MG (50000 UNIT) CAPS capsule Take 50,000 Units by mouth once a week.     No current facility-administered medications on file prior to visit.  [2]  Allergies Allergen Reactions   Other Itching    Pistachios = severe swelling, walnuts, bananas, apples = itching

## 2024-03-12 NOTE — Patient Instructions (Incomplete)
 First Trimester of Pregnancy: What to Know  The first trimester of pregnancy starts on the first day of your last monthly period until the end of week 13. This is months 1 through 3 of pregnancy. A week after a sperm fertilizes an egg, the egg will implant into the wall of the uterus and begin to develop into a baby. Body changes during your first trimester Your body goes through many changes during pregnancy. The changes usually return to normal after your baby is born. Physical changes Your breasts may grow larger and may hurt. The area around your nipples may get darker. Your periods will stop. Your hair and nails may grow faster. You may pee more often. Health changes You may tire easily. Your gums may bleed and may be sensitive when you brush and floss. You may not feel hungry. You may have heartburn. You may throw up or feel like you may throw up. You may want to eat some foods, but not others. You may have headaches. You may have trouble pooping (constipation). Other changes Your emotions may change from day to day. You may have more dreams. Follow these instructions at home: Medicines Talk to your health care provider if you're taking medicines. Ask if the medicines are safe to take during pregnancy. Your provider may change the medicines that you take. Do not take any medicines unless told to by your provider. Take a prenatal vitamin that has at least 600 micrograms (mcg) of folic acid. Do not use herbal medicines, illegal substances, or medicines that are not approved by your provider. Eating and drinking While you're pregnant your body needs extra food for your growing baby. Talk with your provider about what to eat while pregnant. Activity Most women are able to exercise during pregnancy. Exercises may need to change as your pregnancy goes on. Talk to your provider about your activities and exercise routines. Relieving pain and discomfort Wear a good, supportive bra if  your breasts hurt. Rest with your legs raised if you have leg cramps or low back pain. Safety Wear your seatbelt at all times when you're in a car. Talk to your provider if someone hits you, hurts you, or yells at you. Talk with your provider if you're feeling sad or have thoughts of hurting yourself. Lifestyle Certain things can be harmful while you're pregnant. Follow these rules: Do not use hot tubs, steam rooms, or saunas. Do not douche. Do not use tampons or scented pads. Do not drink alcohol,smoke, vape, or use products with nicotine or tobacco in them. If you need help quitting, talk with your provider. Avoid cat litter boxes and soil used by cats. These things carry germs that can cause harm to your pregnancy and your baby. General instructions Keep all follow-up visits. It helps you and your unborn baby stay as healthy as possible. Write down your questions. Take them to your visits. Your provider will: Talk with you about your overall health. Give you advice or refer you to specialists who can help with different needs, including: Prenatal education classes. Mental health and counseling. Foods and healthy eating. Ask for help if you need help with food. Call your dentist and ask to be seen. Brush your teeth with a soft toothbrush. Floss gently. Where to find more information American Pregnancy Association: americanpregnancy.org [employer]: acog.org Office on [employer] Health: [place] Contact a health care provider if: You feel dizzy, faint, or have a fever. You vomit or have watery poop (  diarrhea) for 2 days or more. You have abnormal discharge or bleeding from your vagina. You have pain when you pee or your pee smells bad. You have cramps, pain, or pressure in your belly area. Get help right away if: You have trouble breathing or chest pain. You have any kind of injury, such as from a fall or a car crash. These symptoms may  be an emergency. Get help right away. Call 911. Do not wait to see if the symptoms will go away. Do not drive yourself to the hospital. This information is not intended to replace advice given to you by your health care provider. Make sure you discuss any questions you have with your health care provider. Document Revised: 12/08/2023 Document Reviewed: 05/31/2022 Elsevier Patient Education  2025 [employer].  Common Medications Safe in Pregnancy  Acne:      Constipation:  Benzoyl Peroxide     Colace  Clindamycin      Dulcolax Suppository  Topica Erythromycin     Fibercon  Salicylic Acid      Metamucil         Miralax  AVOID:        Senakot   Accutane    Cough:  Retin-A       Cough Drops  Tetracycline      Phenergan  w/ Codeine  if Rx  Minocycline      Robitussin (Plain & DM)  Antibiotics:     Crabs/Lice:  Ceclor       RID  Cephalosporins    AVOID:  E-Mycins      Kwell  Keflex  Macrobid /Macrodantin    Diarrhea:  Penicillin      Kao-Pectate  Zithromax       Imodium AD         PUSH FLUIDS AVOID:       Cipro     Fever:  Tetracycline      Tylenol  (Regular or Extra  Minocycline       Strength)  Levaquin      Extra Strength-Do not          Exceed 8 tabs/24 hrs Caffeine:        200mg /day (equiv. To 1 cup of coffee or  approx. 3 12 oz sodas)         Gas: Cold/Hayfever:       Gas-X  Benadryl       Mylicon  Claritin       Phazyme  **Claritin-D        Chlor-Trimeton    Headaches:  Dimetapp      ASA-Free Excedrin  Drixoral-Non-Drowsy     Cold Compress  Mucinex (Guaifenasin)     Tylenol  (Regular or Extra  Sudafed/Sudafed-12 Hour     Strength)  **Sudafed PE Pseudoephedrine   Tylenol  Cold & Sinus     Vicks Vapor Rub  Zyrtec  **AVOID if Problems With Blood Pressure         Heartburn: Avoid lying down for at least 1 hour after meals  Aciphex      Maalox     Rash:  Milk of Magnesia     Benadryl     Mylanta       1% Hydrocortisone Cream  Pepcid  Pepcid Complete   Sleep  Aids:  Prevacid      Ambien    Prilosec       Benadryl   Rolaids       Chamomile Tea  Tums (Limit 4/day)     Unisom  Tylenol  PM         Warm milk-add vanilla or  Hemorrhoids:       Sugar for taste  Anusol/Anusol H.C.  (RX: Analapram 2.5%)  Sugar Substitutes:  Hydrocortisone OTC     Ok in moderation  Preparation H      Tucks        Vaseline lotion applied to tissue with wiping    Herpes:     Throat:  Acyclovir      Oragel  Famvir  Valtrex     Vaccines:         Flu Shot Leg Cramps:       *Gardasil  Benadryl       Hepatitis A         Hepatitis B Nasal Spray:       Pneumovax  Saline Nasal Spray     Polio Booster         Tetanus Nausea:       Tuberculosis test or PPD  Vitamin B6 25 mg TID   AVOID:    Dramamine      *Gardasil  Emetrol       Live Poliovirus  Ginger Root 250 mg QID    MMR (measles, mumps &  High Complex Carbs @ Bedtime    rebella)  Sea Bands-Accupressure    Varicella (Chickenpox)  Unisom 1/2 tab TID     *No known complications           If received before Pain:         Known pregnancy;   Darvocet       Resume series after  Lortab        Delivery  Percocet    Yeast:   Tramadol      Femstat  Tylenol  3      Gyne-lotrimin  Ultram       Monistat  Vicodin           MISC:         All Sunscreens           Hair Coloring/highlights          Insect Repellant's          (Including DEET)         Mystic Tans   Commonly Asked Questions During Pregnancy  Cats: A parasite can be excreted in cat feces.  To avoid exposure you need to have another person empty the little box.  If you must empty the litter box you will need to wear gloves.  Wash your hands after handling your cat.  This parasite can also be found in raw or undercooked meat so this should also be avoided.  Colds, Sore Throats, Flu: Please check your medication sheet to see what you can take for symptoms.  If your symptoms are unrelieved by these medications please call the office.  Dental Work: Most  any dental work agricultural consultant recommends is permitted.  X-rays should only be taken during the first trimester if absolutely necessary.  Your abdomen should be shielded with a lead apron during all x-rays.  Please notify your provider prior to receiving any x-rays.  Novocaine is fine; gas is not recommended.  If your dentist requires a note from us  prior to dental work please call the office and we will provide one for you.  Exercise: Exercise is an important part of staying healthy during your pregnancy.  You may continue most exercises you were accustomed to prior to pregnancy.  Later  in your pregnancy you will most likely notice you have difficulty with activities requiring balance like riding a bicycle.  It is important that you listen to your body and avoid activities that put you at a higher risk of falling.  Adequate rest and staying well hydrated are a must!  If you have questions about the safety of specific activities ask your provider.    Exposure to Children with illness: Try to avoid obvious exposure; report any symptoms to us  when noted,  If you have chicken pos, red measles or mumps, you should be immune to these diseases.   Please do not take any vaccines while pregnant unless you have checked with your OB provider.  Fetal Movement: After 28 weeks we recommend you do kick counts twice daily.  Lie or sit down in a calm quiet environment and count your baby movements kicks.  You should feel your baby at least 10 times per hour.  If you have not felt 10 kicks within the first hour get up, walk around and have something sweet to eat or drink then repeat for an additional hour.  If count remains less than 10 per hour notify your provider.  Fumigating: Follow your pest control agent's advice as to how long to stay out of your home.  Ventilate the area well before re-entering.  Hemorrhoids:   Most over-the-counter preparations can be used during pregnancy.  Check your medication to see what is  safe to use.  It is important to use a stool softener or fiber in your diet and to drink lots of liquids.  If hemorrhoids seem to be getting worse please call the office.   Hot Tubs:  Hot tubs Jacuzzis and saunas are not recommended while pregnant.  These increase your internal body temperature and should be avoided.  Intercourse:  Sexual intercourse is safe during pregnancy as long as you are comfortable, unless otherwise advised by your provider.  Spotting may occur after intercourse; report any bright red bleeding that is heavier than spotting.  Labor:  If you know that you are in labor, please go to the hospital.  If you are unsure, please call the office and let us  help you decide what to do.  Lifting, straining, etc:  If your job requires heavy lifting or straining please check with your provider for any limitations.  Generally, you should not lift items heavier than that you can lift simply with your hands and arms (no back muscles)  Painting:  Paint fumes do not harm your pregnancy, but may make you ill and should be avoided if possible.  Latex or water based paints have less odor than oils.  Use adequate ventilation while painting.  Permanents & Hair Color:  Chemicals in hair dyes are not recommended as they cause increase hair dryness which can increase hair loss during pregnancy.   Highlighting and permanents are allowed.  Dye may be absorbed differently and permanents may not hold as well during pregnancy.  Sunbathing:  Use a sunscreen, as skin burns easily during pregnancy.  Drink plenty of fluids; avoid over heating.  Tanning Beds:  Because their possible side effects are still unknown, tanning beds are not recommended.  Ultrasound Scans:  Routine ultrasounds are performed at approximately 20 weeks.  You will be able to see your baby's general anatomy an if you would like to know the gender this can usually be determined as well.  If it is questionable when you conceived you may  also receive  an ultrasound early in your pregnancy for dating purposes.  Otherwise ultrasound exams are not routinely performed unless there is a medical necessity.  Although you can request a scan we ask that you pay for it when conducted because insurance does not cover  patient request scans.  Work: If your pregnancy proceeds without complications you may work until your due date, unless your physician or employer advises otherwise.  Round Ligament Pain/Pelvic Discomfort:  Sharp, shooting pains not associated with bleeding are fairly common, usually occurring in the second trimester of pregnancy.  They tend to be worse when standing up or when you remain standing for long periods of time.  These are the result of pressure of certain pelvic ligaments called round ligaments.  Rest, Tylenol  and heat seem to be the most effective relief.  As the womb and fetus grow, they rise out of the pelvis and the discomfort improves.  Please notify the office if your pain seems different than that described.  It may represent a more serious condition.

## 2024-03-16 ENCOUNTER — Encounter: Admitting: Certified Nurse Midwife

## 2024-03-16 DIAGNOSIS — Z113 Encounter for screening for infections with a predominantly sexual mode of transmission: Secondary | ICD-10-CM

## 2024-03-16 DIAGNOSIS — Z130 Encounter for screening for diseases of the blood and blood-forming organs and certain disorders involving the immune mechanism: Secondary | ICD-10-CM

## 2024-03-16 DIAGNOSIS — Z3A12 12 weeks gestation of pregnancy: Secondary | ICD-10-CM

## 2024-03-16 DIAGNOSIS — T7589XA Other specified effects of external causes, initial encounter: Secondary | ICD-10-CM

## 2024-03-16 DIAGNOSIS — Z0184 Encounter for antibody response examination: Secondary | ICD-10-CM

## 2024-03-16 DIAGNOSIS — Z3481 Encounter for supervision of other normal pregnancy, first trimester: Secondary | ICD-10-CM

## 2024-03-16 DIAGNOSIS — Z0283 Encounter for blood-alcohol and blood-drug test: Secondary | ICD-10-CM

## 2024-03-16 DIAGNOSIS — Z1379 Encounter for other screening for genetic and chromosomal anomalies: Secondary | ICD-10-CM

## 2024-03-17 ENCOUNTER — Encounter: Payer: Self-pay | Admitting: Obstetrics

## 2024-03-17 ENCOUNTER — Ambulatory Visit: Admitting: Obstetrics

## 2024-03-17 VITALS — BP 125/70 | HR 95 | Wt 185.0 lb

## 2024-03-17 DIAGNOSIS — Z98891 History of uterine scar from previous surgery: Secondary | ICD-10-CM

## 2024-03-17 DIAGNOSIS — O99210 Obesity complicating pregnancy, unspecified trimester: Secondary | ICD-10-CM | POA: Insufficient documentation

## 2024-03-17 DIAGNOSIS — R87610 Atypical squamous cells of undetermined significance on cytologic smear of cervix (ASC-US): Secondary | ICD-10-CM

## 2024-03-17 DIAGNOSIS — O09899 Supervision of other high risk pregnancies, unspecified trimester: Secondary | ICD-10-CM | POA: Insufficient documentation

## 2024-03-17 DIAGNOSIS — Z3A12 12 weeks gestation of pregnancy: Secondary | ICD-10-CM

## 2024-03-17 DIAGNOSIS — Z36 Encounter for antenatal screening for chromosomal anomalies: Secondary | ICD-10-CM

## 2024-03-17 DIAGNOSIS — Z131 Encounter for screening for diabetes mellitus: Secondary | ICD-10-CM

## 2024-03-17 DIAGNOSIS — Z130 Encounter for screening for diseases of the blood and blood-forming organs and certain disorders involving the immune mechanism: Secondary | ICD-10-CM

## 2024-03-17 DIAGNOSIS — Z124 Encounter for screening for malignant neoplasm of cervix: Secondary | ICD-10-CM

## 2024-03-17 DIAGNOSIS — Z3480 Encounter for supervision of other normal pregnancy, unspecified trimester: Secondary | ICD-10-CM

## 2024-03-17 DIAGNOSIS — Z0283 Encounter for blood-alcohol and blood-drug test: Secondary | ICD-10-CM

## 2024-03-17 DIAGNOSIS — Z113 Encounter for screening for infections with a predominantly sexual mode of transmission: Secondary | ICD-10-CM

## 2024-03-17 NOTE — Progress Notes (Signed)
 "  OBSTETRIC INITIAL PRENATAL VISIT  Subjective:    Tina Gutierrez is being seen today for her first obstetrical visit.  This is not a planned pregnancy, but a welcome surprise. She is a 34 y.o. [id number] female at [redacted]w[redacted]d gestation, Estimated Date of Delivery: 09/27/24 with Patient's last menstrual period was 12/12/2023 (approximate).,  inconsistent with 9 week sono. Using 9wk US  as dating criteria. Relationship with FOB: significant other, living together. Patient does intend to breast feed. Pregnancy history fully reviewed.  OBHx: -G3: pLTCS at [redacted]w[redacted]d for breech; planned IOL for BMI, but PROMed at home; on presentation to hospital, fetus found to be frank breech and had primary LTCS w/Dr. Janit.   OB History  Gravida Para Term Preterm AB Living  4 1 1  0 2 1  SAB IAB Ectopic Multiple Live Births  2 0 0 0 1    # Outcome Date GA Lbr Len/2nd Weight Sex Type Anes PTL Lv  4 Current           3 Term 12/11/22 [redacted]w[redacted]d  5 lb 4 oz (2.38 kg) F CS-LTranv Spinal  LIV     Name: Johnathan Gamer     Apgar1: 8  Apgar5: 9  2 SAB 02/2022     SAB        Birth Comments: chemical  1 SAB 2012     SAB       Gynecologic History:  Last pap smear was 06/05/2022.  Results were ASC-H HPV negative.  Did not have colposcopy done, states Dr. Janit said the cells were from pregnancy and she didn't need the colpo.   Denies h/o abnormal pap smears in the past.  Reports history of STIs:  Chlamydia 10+ years ago (treated) Contraception prior to conception: None  Past Medical History:  Diagnosis Date   Abnormal uterine bleeding 06/27/2017   Anemia    Ovarian cyst    Symptomatic anemia    Patient Active Problem List   Diagnosis Date Noted   Obesity in pregnancy 03/17/2024   Short interval between pregnancies affecting pregnancy, antepartum 03/17/2024   Supervision of high risk pregnancy, antepartum 02/02/2024   History of cesarean section 02/02/2024   Asthma 06/07/2022   Obesity (BMI 35.0-39.9 without  comorbidity) 06/07/2022   Pap smear cannot exclude high grade squamous intraepithelial lesion (ASC-H) 06/07/2022   PCOS (polycystic ovarian syndrome) 11/21/2021   Menometrorrhagia 07/05/2019    Family History  Problem Relation Age of Onset   Healthy Mother    Healthy Father    Healthy Brother    Healthy Brother    Healthy Maternal Grandmother    Healthy Maternal Grandfather    Diabetes Maternal Grandfather        TYPE 2   Healthy Paternal Grandmother    Alzheimer's disease Paternal Grandfather    Dementia Paternal Grandfather     Past Surgical History:  Procedure Laterality Date   CESAREAN SECTION  12/11/2022   Procedure: CESAREAN SECTION;  Surgeon: Janit Alm Agent, MD;  Location: [facility] ORS;  Service: Obstetrics;;   SALVADOR  01/2012   [facility]    Social History   Socioeconomic History   Marital status: Significant Other    Spouse name: Not on file   Number of children: 1   Years of education: 14   Highest education level: Some college, no degree  Occupational History   Occupation: Stay at [employer]  Tobacco Use   Smoking status: Never   Smokeless tobacco: Never  Vaping Use  Vaping status: Never Used  Substance and Sexual Activity   Alcohol use: No   Drug use: Never   Sexual activity: Yes    Partners: Male    Birth control/protection: None  Other Topics Concern   Not on file  Social History Narrative   [place]   Social Drivers of Health   Tobacco Use: Low Risk (03/17/2024)   Patient History    Smoking Tobacco Use: Never    Smokeless Tobacco Use: Never    Passive Exposure: Not on file  Financial Resource Strain: Low Risk (02/02/2024)   Overall Financial Resource Strain (CARDIA)    Difficulty of Paying Living Expenses: Not very hard  Food Insecurity: No Food Insecurity (02/02/2024)   Epic    Worried About Radiation Protection Practitioner of Food in the Last Year: Never true    Ran Out of Food in the Last Year: Never true  Transportation Needs: No Transportation Needs  (02/02/2024)   Epic    Lack of Transportation (Medical): No    Lack of Transportation (Non-Medical): No  Physical Activity: Unknown (02/02/2024)   Exercise Vital Sign    Days of Exercise per Week: Patient declined    Minutes of Exercise per Session: Not on file  Stress: No Stress Concern Present (02/02/2024)   [employer] of Occupational Health - Occupational Stress Questionnaire    Feeling of Stress: Only a little  Social Connections: Moderately Integrated (02/02/2024)   Social Connection and Isolation Panel    Frequency of Communication with Friends and Family: Twice a week    Frequency of Social Gatherings with Friends and Family: Once a week    Attends Religious Services: 1 to 4 times per year    Active Member of [employer] or Organizations: No    Attends Engineer, Structural: Not on file    Marital Status: Living with partner  Intimate Partner Violence: Not At Risk (02/02/2024)   Epic    Fear of Current or Ex-Partner: No    Emotionally Abused: No    Physically Abused: No    Sexually Abused: No  Depression (PHQ2-9): Not on file  Alcohol Screen: Low Risk (02/02/2024)   Alcohol Screen    Last Alcohol Screening Score (AUDIT): 1  Housing: Low Risk (02/02/2024)   Epic    Unable to Pay for Housing in the Last Year: No    Number of Times Moved in the Last Year: 0    Homeless in the Last Year: No  Recent Concern: Housing - High Risk (02/02/2024)   Epic    Unable to Pay for Housing in the Last Year: Yes    Number of Times Moved in the Last Year: 0    Homeless in the Last Year: No  Utilities: At Risk (02/02/2024)   Epic    Threatened with loss of utilities: Yes  Health Literacy: Adequate Health Literacy (02/02/2024)   B1300 Health Literacy    Frequency of need for help with medical instructions: Never    Medications Ordered Prior to Encounter[1]  Allergies[2]  Review of Systems General: Not Present- Fever, Weight Loss and Weight Gain. Skin: Not Present-  Rash. HEENT: Not Present- Blurred Vision, Headache and Bleeding Gums. Respiratory: Not Present- Difficulty Breathing. Breast: Not Present- Breast Mass. Cardiovascular: Not Present- Chest Pain, Elevated Blood Pressure, Fainting / Blacking Out and Shortness of Breath. Gastrointestinal: Not Present- Abdominal Pain, Constipation, Nausea and Vomiting. Female Genitourinary: Not Present- Frequency, Painful Urination, Pelvic Pain, Vaginal Bleeding, Vaginal Discharge, Contractions, regular, Fetal Movements Decreased,  Urinary Complaints and Vaginal Fluid. Musculoskeletal: Not Present- Back Pain and Leg Cramps. Neurological: Not Present- Dizziness. Psychiatric: Not Present- Depression.     Objective:   Blood pressure 125/70, pulse 95, weight 185 lb (83.9 kg), last menstrual period 12/12/2023, currently breastfeeding.  Body mass index is 34.96 kg/m.  General Appearance:    Alert, cooperative, no distress, appears stated age  Head:    Normocephalic, without obvious abnormality, atraumatic  Eyes:    PERRL, conjunctiva/corneas clear, EOM's intact, both eyes  Ears:    Normal external ear canals, both ears  Nose:   Nares normal, septum midline, mucosa normal, no drainage or sinus tenderness  Throat:   Lips, mucosa, and tongue normal; teeth and gums normal  Neck:   Supple, symmetrical, trachea midline, no adenopathy; thyroid: no enlargement/tenderness/nodules; no carotid bruit or JVD  Back:     Symmetric, no curvature, ROM normal, no CVA tenderness  Lungs:     Clear to auscultation bilaterally, respirations unlabored  Chest Wall:    No tenderness or deformity   Heart:    Regular rate and rhythm, S1 and S2 normal, no murmur, rub or gallop  Breast Exam:    No tenderness, masses, or nipple abnormality  Abdomen:     Soft, non-tender, bowel sounds active all four quadrants, no masses, no organomegaly.  FHT 165  bpm.  Genitalia:    Pelvic:external genitalia normal, vagina without lesions, discharge, or  tenderness, rectovaginal septum  normal. Cervix normal in appearance, no cervical motion tenderness, no adnexal masses or tenderness.  Pregnancy positive findings: uterine enlargement: 12 wk size, nontender.   Rectal:    Normal external sphincter.  No hemorrhoids appreciated. Internal exam not done.   Extremities:   Extremities normal, atraumatic, no cyanosis or edema  Pulses:   2+ and symmetric all extremities  Skin:   Skin color, texture, turgor normal, no rashes or lesions  Lymph nodes:   Cervical, supraclavicular, and axillary nodes normal  Neurologic:   CNII-XII intact, normal strength, sensation and reflexes throughout     Assessment:   1. Supervision of other normal pregnancy, antepartum   2. [redacted] weeks gestation of pregnancy   3. Screen for STD (sexually transmitted disease)   4. Screening for diabetes mellitus   5. Screening, anemia, deficiency, iron   6. Encounter for antenatal screening for chromosomal anomalies   7. Encounter for drug screening   8. Cervical cancer screening   9. Pap smear cannot exclude high grade squamous intraepithelial lesion (ASC-H)   10. Obesity in pregnancy   11. History of cesarean section   12. Short interval between pregnancies affecting pregnancy, antepartum     Plan:   Supervision of high risk pregnancy: short interval, prior x 1, and BMI - Initial labs ordered. - Prenatal vitamins encouraged. - Problem list reviewed and updated. - New OB counseling:  The patient has been given an overview regarding routine prenatal care.  Recommendations regarding diet, weight gain, and exercise in pregnancy were given. - Prenatal testing, optional genetic testing, and ultrasound use in pregnancy were reviewed.  Traditional genetic screening vs cell-fee DNA genetic screening discussed, including risks and benefits. Testing ordered. Carrier screening also discussed and pt desires, done today. - Benefits of Breast Feeding were discussed. The patient is encouraged  to consider nursing her baby post partum.  2. Prior cesarean x 1 and short interpregnancy interval:  -Last delivery 13mos prior to current pregnancy -pLTCS for breech, unsure of delivery mode at this time--if desires  TOLAC, will need 28wk counseling with MD and should discuss increased risk for rupture due to short interval   3. BMI: pre-gravid = 34.03 -Pt already taking LDASA at home -- continue -PIH baseline labs and TSH ordered -Weight gain goal of 11-20lbs total  4. ASC-H pap: HPV negative, 06/07/22 -Pt states at her prior postpartum visit, Dr. Janit told her she didn't need a colposcopy, the abnormal cells were from her pregnancy.  -We did a repeat pap today  Follow up in 4 weeks.   Estil Mangle, DO [facility]    [1]  Current Outpatient Medications on File Prior to Visit  Medication Sig Dispense Refill   EPIPEN 2-PAK 0.3 MG/0.3ML SOAJ injection SMARTSIG:1 pre-filled pen syringe IM Once     fluticasone (FLONASE) 50 MCG/ACT nasal spray Place into both nostrils as needed.     Prenatal Vit-Fe Fumarate-FA (MULTIVITAMIN-PRENATAL) 27-0.8 MG TABS tablet Take 2 tablets by mouth daily at 12 noon.     VENTOLIN  HFA 108 (90 Base) MCG/ACT inhaler Inhale into the lungs.     Ferrous Sulfate (IRON) 325 (65 Fe) MG TABS Take 1 tablet by mouth every other day. (Patient not taking: Reported on 03/17/2024)     metFORMIN (GLUCOPHAGE) 500 MG tablet Take 500 mg by mouth daily. (Patient not taking: Reported on 03/17/2024)     sertraline (ZOLOFT) 50 MG tablet Take 50 mg by mouth daily. (Patient not taking: Reported on 03/17/2024)     Vitamin D, Ergocalciferol, (DRISDOL) 1.25 MG (50000 UNIT) CAPS capsule Take 50,000 Units by mouth once a week. (Patient not taking: Reported on 03/17/2024)     No current facility-administered medications on file prior to visit.  [2]  Allergies Allergen Reactions   Other Itching    Pistachios = severe swelling, walnuts, bananas, apples = itching   "

## 2024-03-18 LAB — CBC/D/PLT+RPR+RH+ABO+RUBIGG...
Antibody Screen: NEGATIVE
Basophils Absolute: 0 10*3/uL (ref 0.0–0.2)
Basos: 0 %
EOS (ABSOLUTE): 0.2 10*3/uL (ref 0.0–0.4)
Eos: 2 %
HCV Ab: NONREACTIVE
HIV Screen 4th Generation wRfx: NONREACTIVE
Hematocrit: 37.3 % (ref 34.0–46.6)
Hemoglobin: 12.2 g/dL (ref 11.1–15.9)
Hepatitis B Surface Ag: NEGATIVE
Immature Grans (Abs): 0 10*3/uL (ref 0.0–0.1)
Immature Granulocytes: 0 %
Lymphocytes Absolute: 2.1 10*3/uL (ref 0.7–3.1)
Lymphs: 24 %
MCH: 26.2 pg — ABNORMAL LOW (ref 26.6–33.0)
MCHC: 32.7 g/dL (ref 31.5–35.7)
MCV: 80 fL (ref 79–97)
Monocytes Absolute: 0.4 10*3/uL (ref 0.1–0.9)
Monocytes: 5 %
Neutrophils Absolute: 6 10*3/uL (ref 1.4–7.0)
Neutrophils: 69 %
Platelets: 311 10*3/uL (ref 150–450)
RBC: 4.65 x10E6/uL (ref 3.77–5.28)
RDW: 14.6 % (ref 11.7–15.4)
RPR Ser Ql: NONREACTIVE
Rh Factor: POSITIVE
Rubella Antibodies, IGG: 2.55 {index}
Varicella zoster IgG: REACTIVE
WBC: 8.7 10*3/uL (ref 3.4–10.8)

## 2024-03-18 LAB — COMPREHENSIVE METABOLIC PANEL WITH GFR
ALT: 27 [IU]/L (ref 0–32)
AST: 17 [IU]/L (ref 0–40)
Albumin: 3.9 g/dL (ref 3.9–4.9)
Alkaline Phosphatase: 54 [IU]/L (ref 41–116)
BUN/Creatinine Ratio: 8 — ABNORMAL LOW (ref 9–23)
BUN: 4 mg/dL — ABNORMAL LOW (ref 6–20)
Bilirubin Total: 0.2 mg/dL (ref 0.0–1.2)
CO2: 19 mmol/L — ABNORMAL LOW (ref 20–29)
Calcium: 9.4 mg/dL (ref 8.7–10.2)
Chloride: 105 mmol/L (ref 96–106)
Creatinine, Ser: 0.51 mg/dL — ABNORMAL LOW (ref 0.57–1.00)
Globulin, Total: 2.4 g/dL (ref 1.5–4.5)
Glucose: 126 mg/dL — ABNORMAL HIGH (ref 70–99)
Potassium: 3.7 mmol/L (ref 3.5–5.2)
Sodium: 138 mmol/L (ref 134–144)
Total Protein: 6.3 g/dL (ref 6.0–8.5)
eGFR: 128 mL/min/{1.73_m2}

## 2024-03-18 LAB — URINALYSIS, ROUTINE W REFLEX MICROSCOPIC
Bilirubin, UA: NEGATIVE
Glucose, UA: NEGATIVE
Leukocytes,UA: NEGATIVE
Nitrite, UA: NEGATIVE
RBC, UA: NEGATIVE
Specific Gravity, UA: 1.026 (ref 1.005–1.030)
Urobilinogen, Ur: 0.2 mg/dL (ref 0.2–1.0)
pH, UA: 5.5 (ref 5.0–7.5)

## 2024-03-18 LAB — HEMOGLOBIN A1C
Est. average glucose Bld gHb Est-mCnc: 105 mg/dL
Hgb A1c MFr Bld: 5.3 % (ref 4.8–5.6)

## 2024-03-18 LAB — HCV INTERPRETATION

## 2024-03-18 LAB — PROTEIN / CREATININE RATIO, URINE
Creatinine, Urine: 214.2 mg/dL
Protein, Ur: 16.4 mg/dL
Protein/Creat Ratio: 77 mg/g{creat} (ref 0–200)

## 2024-03-18 LAB — TSH RFX ON ABNORMAL TO FREE T4: TSH: 0.614 u[IU]/mL (ref 0.450–4.500)

## 2024-03-19 LAB — CULTURE, OB URINE

## 2024-03-19 LAB — URINE CULTURE, OB REFLEX

## 2024-04-14 ENCOUNTER — Encounter: Admitting: Certified Nurse Midwife
